# Patient Record
Sex: Female | Born: 1990
Health system: Southern US, Community
[De-identification: ages and names within clinical notes are randomized; demographics above are authoritative.]

## PROBLEM LIST (undated history)

## (undated) DIAGNOSIS — F502 Bulimia nervosa: Secondary | ICD-10-CM

## (undated) DIAGNOSIS — F32A Depression, unspecified: Secondary | ICD-10-CM

## (undated) DIAGNOSIS — F419 Anxiety disorder, unspecified: Secondary | ICD-10-CM

## (undated) DIAGNOSIS — F329 Major depressive disorder, single episode, unspecified: Secondary | ICD-10-CM

## (undated) HISTORY — DX: Depression, unspecified: F32.A

## (undated) HISTORY — DX: Anxiety disorder, unspecified: F41.9

## (undated) HISTORY — DX: Major depressive disorder, single episode, unspecified: F32.9

---
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
## 2011-04-28 ENCOUNTER — Ambulatory Visit (INDEPENDENT_AMBULATORY_CARE_PROVIDER_SITE_OTHER): Payer: Self-pay | Admitting: Family Medicine

## 2011-04-28 ENCOUNTER — Encounter: Payer: Self-pay | Admitting: Family Medicine

## 2011-04-28 DIAGNOSIS — F32A Depression, unspecified: Secondary | ICD-10-CM

## 2011-04-28 DIAGNOSIS — N318 Other neuromuscular dysfunction of bladder: Secondary | ICD-10-CM

## 2011-04-28 DIAGNOSIS — Z01419 Encounter for gynecological examination (general) (routine) without abnormal findings: Secondary | ICD-10-CM

## 2011-04-28 DIAGNOSIS — N3281 Overactive bladder: Secondary | ICD-10-CM

## 2011-04-28 DIAGNOSIS — G4700 Insomnia, unspecified: Secondary | ICD-10-CM

## 2011-04-28 DIAGNOSIS — Z0000 Encounter for general adult medical examination without abnormal findings: Secondary | ICD-10-CM

## 2011-04-28 DIAGNOSIS — F341 Dysthymic disorder: Secondary | ICD-10-CM

## 2011-04-28 DIAGNOSIS — F329 Major depressive disorder, single episode, unspecified: Secondary | ICD-10-CM

## 2011-04-28 MED ORDER — CITALOPRAM HYDROBROMIDE 10 MG PO TABS: ORAL_TABLET | ORAL | Status: DC

## 2011-04-28 NOTE — Patient Instructions (Signed)
Try the celexa.  It does have a small associated weight gain, but you may feel much better on it. Try to exercise (walk) for 30 min at a time daily.  This helps anxiety a lot.   Come back and see me in 2-3 weeks.  We may need to increase the dose.

## 2011-05-01 ENCOUNTER — Encounter: Payer: Self-pay | Admitting: Family Medicine

## 2011-05-01 ENCOUNTER — Telehealth: Payer: Self-pay | Admitting: Family Medicine

## 2011-05-01 DIAGNOSIS — Z01419 Encounter for gynecological examination (general) (routine) without abnormal findings: Secondary | ICD-10-CM | POA: Insufficient documentation

## 2011-05-01 DIAGNOSIS — G4700 Insomnia, unspecified: Secondary | ICD-10-CM | POA: Insufficient documentation

## 2011-05-01 DIAGNOSIS — F319 Bipolar disorder, unspecified: Secondary | ICD-10-CM | POA: Insufficient documentation

## 2011-05-01 DIAGNOSIS — N3281 Overactive bladder: Secondary | ICD-10-CM | POA: Insufficient documentation

## 2011-05-01 MED ORDER — SOLIFENACIN SUCCINATE 10 MG PO TABS: 10.0000 mg | ORAL_TABLET | Freq: Every day | ORAL | Status: DC

## 2011-05-01 MED ORDER — AMITRIPTYLINE HCL 25 MG PO TABS: 25.0000 mg | ORAL_TABLET | Freq: Every day | ORAL | Status: DC

## 2011-05-01 NOTE — Assessment & Plan Note (Signed)
Will start celexa and see back 2-3 weeks

## 2011-05-01 NOTE — Telephone Encounter (Signed)
Confirmed with mom that no concern for bipolar with previous psychiatrist.  Does not have higher energy periods of time, does not get involved in many projects or go without sleep.  Mom confirmed that she had an appt to see me in 3 weeks.

## 2011-05-01 NOTE — Progress Notes (Signed)
  Subjective:     Jeanette Nicholson is a 20 y.o. female and is here for a comprehensive physical exam. The patient reports problems - worried about her weight, anxiety and depression.  Anxiety/depression-  had been treated with buspar in past.  No meds x 1 year.  No SI/Hi.  No manic episodes. Feels that she has OCD component.  Cannot go to psych now as has no insurance.    Weight-  Concerned about weight gain.  Very external locus of control--worried that it might happen without ability to voice how she might have an effect on that outcome.    History   Social History  . Marital Status: Single    Spouse Name: N/A    Number of Children: N/A  . Years of Education: N/A   Occupational History  . Not on file.   Social History Main Topics  . Smoking status: Never Smoker   . Smokeless tobacco: Not on file  . Alcohol Use: No  . Drug Use: No  . Sexually Active: No   Other Topics Concern  . Not on file   Social History Narrative  . No narrative on file   No health maintenance topics applied.  The following portions of the patient's history were reviewed and updated as appropriate: allergies, current medications, past family history, past medical history, past social history, past surgical history and problem list.  Review of Systems Pertinent items are noted in HPI.   Objective:   Vital signs reviewed General appearance - alert, well appearing, and in no distress and oriented to person, place, and time Heart - normal rate, regular rhythm, normal S1, S2, no murmurs, rubs, clicks or gallops Chest - clear to auscultation, no wheezes, rales or rhonchi, symmetric air entry, no tachypnea, retractions or cyanosis Abdomen - soft, nontender, nondistended, no masses or organomegaly Extremities - peripheral pulses normal, no pedal edema, no clubbing or cyanosis Neurological - alert, oriented, normal speech, no focal findings or movement disorder noted, screening mental status exam normal,  neck supple without rigidity, cranial nerves II through XII intact    Assessment:    Healthy female exam.   1. Anxiety/depression-  Had been on buspar and concerta in past.  No meds x 1 year after turning 19.  Will start celexa today for GAD and depression.  See back in 2-3 weeks.  2. Weight concern-  Pt very concerned about gaining weight.  Reviewed diet and exercise (emphasize internal control of actions influencing outcomes)     Plan:     See After Visit Summary for Counseling Recommendations

## 2011-05-19 ENCOUNTER — Ambulatory Visit: Payer: Self-pay | Admitting: Family Medicine

## 2011-05-27 ENCOUNTER — Ambulatory Visit: Payer: Self-pay | Admitting: Family Medicine

## 2011-10-29 ENCOUNTER — Other Ambulatory Visit: Payer: Self-pay | Admitting: Family Medicine

## 2011-10-31 NOTE — Telephone Encounter (Signed)
Refill request

## 2012-05-07 ENCOUNTER — Emergency Department (HOSPITAL_COMMUNITY)
Admission: EM | Admit: 2012-05-07 | Discharge: 2012-05-07 | Disposition: A | Discharge status: Home / Self Care | Payer: Self-pay | Attending: Emergency Medicine | Admitting: Emergency Medicine

## 2012-05-07 ENCOUNTER — Encounter (HOSPITAL_COMMUNITY): Payer: Self-pay | Admitting: *Deleted

## 2012-05-07 DIAGNOSIS — R51 Headache: Secondary | ICD-10-CM | POA: Insufficient documentation

## 2012-05-07 DIAGNOSIS — R202 Paresthesia of skin: Secondary | ICD-10-CM

## 2012-05-07 DIAGNOSIS — F419 Anxiety disorder, unspecified: Secondary | ICD-10-CM

## 2012-05-07 DIAGNOSIS — R4182 Altered mental status, unspecified: Secondary | ICD-10-CM | POA: Insufficient documentation

## 2012-05-07 DIAGNOSIS — R42 Dizziness and giddiness: Secondary | ICD-10-CM | POA: Insufficient documentation

## 2012-05-07 DIAGNOSIS — F329 Major depressive disorder, single episode, unspecified: Secondary | ICD-10-CM | POA: Insufficient documentation

## 2012-05-07 DIAGNOSIS — R209 Unspecified disturbances of skin sensation: Secondary | ICD-10-CM | POA: Insufficient documentation

## 2012-05-07 DIAGNOSIS — F3289 Other specified depressive episodes: Secondary | ICD-10-CM | POA: Insufficient documentation

## 2012-05-07 DIAGNOSIS — F411 Generalized anxiety disorder: Secondary | ICD-10-CM | POA: Insufficient documentation

## 2012-05-07 LAB — DIFFERENTIAL
Basophils Absolute: 0 10*3/uL (ref 0.0–0.1)
Lymphocytes Relative: 25 % (ref 12–46)
Monocytes Absolute: 1.1 10*3/uL — ABNORMAL HIGH (ref 0.1–1.0)
Neutro Abs: 8.8 10*3/uL — ABNORMAL HIGH (ref 1.7–7.7)
Neutrophils Relative %: 65 % (ref 43–77)

## 2012-05-07 LAB — URINE MICROSCOPIC-ADD ON

## 2012-05-07 LAB — URINALYSIS, ROUTINE W REFLEX MICROSCOPIC
Glucose, UA: NEGATIVE mg/dL
pH: 7.5 (ref 5.0–8.0)

## 2012-05-07 LAB — POCT I-STAT, CHEM 8
BUN: 7 mg/dL (ref 6–23)
Calcium, Ion: 1.24 mmol/L (ref 1.12–1.32)
Chloride: 105 mEq/L (ref 96–112)
Potassium: 4.1 mEq/L (ref 3.5–5.1)

## 2012-05-07 LAB — CBC
HCT: 39.7 % (ref 36.0–46.0)
Hemoglobin: 13.1 g/dL (ref 12.0–15.0)
RDW: 13.6 % (ref 11.5–15.5)
WBC: 13.6 10*3/uL — ABNORMAL HIGH (ref 4.0–10.5)

## 2012-05-07 LAB — POCT PREGNANCY, URINE: Preg Test, Ur: NEGATIVE

## 2012-05-07 MED ORDER — LORAZEPAM 1 MG PO TABS: 1.0000 mg | ORAL_TABLET | Freq: Three times a day (TID) | ORAL | Status: AC | PRN

## 2012-05-07 MED ORDER — LORAZEPAM 1 MG PO TABS
1.0000 mg | ORAL_TABLET | Freq: Once | ORAL | Status: AC
  Administered 2012-05-07: 1 mg via ORAL
  Filled 2012-05-07: qty 1

## 2012-05-07 NOTE — ED Notes (Signed)
Pt ambulatory to PODA2 from triage

## 2012-05-07 NOTE — ED Notes (Signed)
Patient states after she returned from work this morning she became dizzy and disoriented and had a sharp and severe headache, patient states facial numbness and arm numbness and tingling.  Patient reports she is confused about who she is and who family members. Patient states same episode occur last week but she just became dizzy.

## 2012-05-07 NOTE — ED Provider Notes (Signed)
Medical screening examination/treatment/procedure(s) were performed by non-physician practitioner and as supervising physician I was immediately available for consultation/collaboration.  Mikell Kazlauskas, MD 05/07/12 2322 

## 2012-05-07 NOTE — ED Notes (Signed)
Discharge inst given  Voiced understanding 

## 2012-05-07 NOTE — Discharge Instructions (Signed)
Anxiety and Panic Attacks Your caregiver has informed you that you are having an anxiety or panic attack. There may be many forms of this. Most of the time these attacks come suddenly and without warning. They come at any time of day, including periods of sleep, and at any time of life. They may be strong and unexplained. Although panic attacks are very scary, they are physically harmless. Sometimes the cause of your anxiety is not known. Anxiety is a protective mechanism of the body in its fight or flight mechanism. Most of these perceived danger situations are actually nonphysical situations (such as anxiety over losing a job). CAUSES  The causes of an anxiety or panic attack are many. Panic attacks may occur in otherwise healthy people given a certain set of circumstances. There may be a genetic cause for panic attacks. Some medications may also have anxiety as a side effect. SYMPTOMS  Some of the most common feelings are:  Intense terror.   Dizziness, feeling faint.   Hot and cold flashes.   Fear of going crazy.   Feelings that nothing is real.   Sweating.   Shaking.   Chest pain or a fast heartbeat (palpitations).   Smothering, choking sensations.   Feelings of impending doom and that death is near.   Tingling of extremities, this may be from over-breathing.   Altered reality (derealization).   Being detached from yourself (depersonalization).  Several symptoms can be present to make up anxiety or panic attacks. DIAGNOSIS  The evaluation by your caregiver will depend on the type of symptoms you are experiencing. The diagnosis of anxiety or panic attack is made when no physical illness can be determined to be a cause of the symptoms. TREATMENT  Treatment to prevent anxiety and panic attacks may include:  Avoidance of circumstances that cause anxiety.   Reassurance and relaxation.   Regular exercise.   Relaxation therapies, such as yoga.   Psychotherapy with a  psychiatrist or therapist.   Avoidance of caffeine, alcohol and illegal drugs.   Prescribed medication.  SEEK IMMEDIATE MEDICAL CARE IF:   You experience panic attack symptoms that are different than your usual symptoms.   You have any worsening or concerning symptoms.  Document Released: 12/05/2005 Document Revised: 11/24/2011 Document Reviewed: 04/08/2010 [facility] Patient Information 2012 [place], [place].Paresthesia Paresthesia is an abnormal burning or prickling sensation. This sensation is generally felt in the hands, arms, legs, or feet. However, it may occur in any part of the body. It is usually not painful. The feeling may be described as:  Tingling or numbness.   "Pins and needles."   Skin crawling.   Buzzing.   Limbs "falling asleep."   Itching.  Most people experience temporary (transient) paresthesia at some time in their lives. CAUSES  Paresthesia may occur when you breathe too quickly (hyperventilation). It can also occur without any apparent cause. Commonly, paresthesia occurs when pressure is placed on a nerve. The feeling quickly goes away once the pressure is removed. For some people, however, paresthesia is a long-lasting (chronic) condition caused by an underlying disorder. The underlying disorder may be:  A traumatic, direct injury to nerves. Examples include a:   Broken (fractured) neck.   Fractured skull.   A disorder affecting the brain and spinal cord (central nervous system). Examples include:   Transverse myelitis.   Encephalitis.   Transient ischemic attack.   Multiple sclerosis.   Stroke.   Tumor or blood vessel problems, such as an arteriovenous malformation pressing  against the brain or spinal cord.   A condition that damages the peripheral nerves (peripheral neuropathy). Peripheral nerves are not part of the brain and spinal cord. These conditions include:   Diabetes.   Peripheral vascular disease.   Nerve entrapment syndromes,  such as carpal tunnel syndrome.   Shingles.   Hypothyroidism.   Vitamin B12 deficiencies.   Alcoholism.   Heavy metal poisoning (lead, arsenic).   Rheumatoid arthritis.   Systemic lupus erythematosus.  DIAGNOSIS  Your caregiver will attempt to find the underlying cause of your paresthesia. Your caregiver may:  Take your medical history.   Perform a physical exam.   Order various lab tests.   Order imaging tests.  TREATMENT  Treatment for paresthesia depends on the underlying cause. HOME CARE INSTRUCTIONS  Avoid drinking alcohol.   You may consider massage or acupuncture to help relieve your symptoms.   Keep all follow-up appointments as directed by your caregiver.  SEEK IMMEDIATE MEDICAL CARE IF:   You feel weak.   You have trouble walking or moving.   You have problems with speech or vision.   You feel confused.   You cannot control your bladder or bowel movements.   You feel numbness after an injury.   You faint.   Your burning or prickling feeling gets worse when walking.   You have pain, cramps, or dizziness.   You develop a rash.  MAKE SURE YOU:  Understand these instructions.   Will watch your condition.   Will get help right away if you are not doing well or get worse.  Document Released: 11/25/2002 Document Revised: 11/24/2011 Document Reviewed: 08/26/2011 [facility] Patient Information 2012 [place], [place].

## 2012-05-07 NOTE — ED Provider Notes (Signed)
History     CSN: [id number]  Arrival date & time 05/07/12  1423   First MD Initiated Contact with Patient 05/07/12 1703      Chief Complaint  Patient presents with  . Dizziness    facial numbness  . Altered Mental Status    confused  . Headache    (Consider location/radiation/quality/duration/timing/severity/associated sxs/prior treatment) HPI Comments: Patient is otherwise healthy 21 year old who presents today because "I don't know who I am and where I am".  She states that her onset of symptoms started at about noon today.  Her boyfriend became concerned because she was becoming more confused.  He states that she is repeating herself and asks the same questions over and over again.  She reports left frontal headache and dizziness with the sensation that she is moving as well.  She reports numbness to bilateral face, arms and legs.  She reports subjective weakness as well.  She denies fever, chills, syncope, blurred vision, nausea, vomiting, ear pain, chest pain, shortness of breath, abdominal pain, dysuria, hematuria, vaginal bleeding, discharge or the possibility of pregnancy.  Only history is of anxiety and depression.  Patient is a 21 y.o. female presenting with altered mental status and headaches. The history is provided by the patient and a friend. No language interpreter was used.  Altered Mental Status This is a new problem. The current episode started today. The problem occurs constantly. The problem has been unchanged. Associated symptoms include headaches, numbness and vertigo. Pertinent negatives include no abdominal pain, anorexia, arthralgias, change in bowel habit, chest pain, chills, congestion, coughing, diaphoresis, fatigue, fever, joint swelling, myalgias, nausea, neck pain, rash, sore throat, swollen glands, urinary symptoms, visual change, vomiting or weakness. The symptoms are aggravated by nothing. She has tried nothing for the symptoms. The treatment provided no  relief.  Headache  Pertinent negatives include no anorexia, no fever, no nausea and no vomiting.    Past Medical History  Diagnosis Date  . Anxiety   . Depression     History reviewed. No pertinent past surgical history.  Family History  Problem Relation Age of Onset  . Depression Mother   . Mental illness Mother     History  Substance Use Topics  . Smoking status: Never Smoker   . Smokeless tobacco: Not on file  . Alcohol Use: No    OB History    Grav Para Term Preterm Abortions TAB SAB Ect Mult Living                  Review of Systems  Constitutional: Negative for fever, chills, diaphoresis and fatigue.  HENT: Negative for congestion, sore throat and neck pain.   Respiratory: Negative for cough.   Cardiovascular: Negative for chest pain.  Gastrointestinal: Negative for nausea, vomiting, abdominal pain, anorexia and change in bowel habit.  Musculoskeletal: Negative for myalgias, joint swelling and arthralgias.  Skin: Negative for rash.  Neurological: Positive for vertigo, numbness and headaches. Negative for weakness.  Psychiatric/Behavioral: Positive for altered mental status.  All other systems reviewed and are negative.    Allergies  Review of patient's allergies indicates no known allergies.  Home Medications  No current outpatient prescriptions on file.  BP 120/78  Pulse 86  Temp(Src) 98 F (36.7 C) (Oral)  Resp 16  SpO2 100%  LMP 04/25/2012  Physical Exam  Nursing note and vitals reviewed. Constitutional: She appears well-developed and well-nourished. No distress.       Smiles and interacts on exam  HENT:  Head: Normocephalic and atraumatic.  Right Ear: External ear normal.  Left Ear: External ear normal.  Nose: Nose normal.  Mouth/Throat: Oropharynx is clear and moist. No oropharyngeal exudate.  Eyes: Conjunctivae are normal. Pupils are equal, round, and reactive to light. No scleral icterus.  Neck: Normal range of motion. Neck supple.    Cardiovascular: Normal rate, regular rhythm and normal heart sounds.  Exam reveals no gallop and no friction rub.   No murmur heard. Pulmonary/Chest: Effort normal and breath sounds normal. No respiratory distress. She has no wheezes. She has no rales. She exhibits no tenderness.  Abdominal: Soft. Bowel sounds are normal. She exhibits no distension and no mass. There is no tenderness. There is no rebound and no guarding.  Musculoskeletal: Normal range of motion. She exhibits no edema and no tenderness.  Lymphadenopathy:    She has no cervical adenopathy.  Neurological: She is alert. She has normal strength. She displays no atrophy and no tremor. A sensory deficit is present. No cranial nerve deficit. She exhibits normal muscle tone. She displays a negative Romberg sign. Coordination and gait normal. GCS eye subscore is 4. GCS verbal subscore is 5. GCS motor subscore is 6.  Reflex Scores:      Tricep reflexes are 2+ on the right side and 2+ on the left side.      Bicep reflexes are 2+ on the right side and 2+ on the left side.      Patellar reflexes are 2+ on the right side and 2+ on the left side.      Achilles reflexes are 2+ on the right side and 2+ on the left side.      Patient reports numbness to entire face, arms and legs, subjective weakness though strength testing is 5/5 in all muscle groups, gait and coordination is normal, remains confused to person and place, though she was able to inform registration of all of her pertinent information including verifying her birthday, address, next of kin information.  Skin: Skin is warm and dry. No rash noted. No erythema. No pallor.  Psychiatric: She has a normal mood and affect. Her behavior is normal. Judgment and thought content normal.    ED Course  Procedures (including critical care time)  Labs Reviewed  CBC - Abnormal; Notable for the following:    WBC 13.6 (*)    All other components within normal limits  DIFFERENTIAL - Abnormal;  Notable for the following:    Neutro Abs 8.8 (*)    Monocytes Absolute 1.1 (*)    All other components within normal limits  POCT I-STAT, CHEM 8  URINALYSIS, ROUTINE W REFLEX MICROSCOPIC   No results found. Results for orders placed during the hospital encounter of 05/07/12  URINALYSIS, ROUTINE W REFLEX MICROSCOPIC      Component Value Range   Color, Urine YELLOW  YELLOW    APPearance TURBID (*) CLEAR    Specific Gravity, Urine 1.022  1.005 - 1.030    pH 7.5  5.0 - 8.0    Glucose, UA NEGATIVE  NEGATIVE (mg/dL)   Hgb urine dipstick NEGATIVE  NEGATIVE    Bilirubin Urine NEGATIVE  NEGATIVE    Ketones, ur NEGATIVE  NEGATIVE (mg/dL)   Protein, ur NEGATIVE  NEGATIVE (mg/dL)   Urobilinogen, UA 0.2  0.0 - 1.0 (mg/dL)   Nitrite NEGATIVE  NEGATIVE    Leukocytes, UA TRACE (*) NEGATIVE   CBC      Component Value Range   WBC 13.6 (*)  4.0 - 10.5 (K/uL)   RBC 4.48  3.87 - 5.11 (MIL/uL)   Hemoglobin 13.1  12.0 - 15.0 (g/dL)   HCT 96.0  45.4 - 09.8 (%)   MCV 88.6  78.0 - 100.0 (fL)   MCH 29.2  26.0 - 34.0 (pg)   MCHC 33.0  30.0 - 36.0 (g/dL)   RDW 11.9  14.7 - 82.9 (%)   Platelets 276  150 - 400 (K/uL)  DIFFERENTIAL      Component Value Range   Neutrophils Relative 65  43 - 77 (%)   Neutro Abs 8.8 (*) 1.7 - 7.7 (K/uL)   Lymphocytes Relative 25  12 - 46 (%)   Lymphs Abs 3.4  0.7 - 4.0 (K/uL)   Monocytes Relative 8  3 - 12 (%)   Monocytes Absolute 1.1 (*) 0.1 - 1.0 (K/uL)   Eosinophils Relative 2  0 - 5 (%)   Eosinophils Absolute 0.3  0.0 - 0.7 (K/uL)   Basophils Relative 0  0 - 1 (%)   Basophils Absolute 0.0  0.0 - 0.1 (K/uL)  POCT I-STAT, CHEM 8      Component Value Range   Sodium 142  135 - 145 (mEq/L)   Potassium 4.1  3.5 - 5.1 (mEq/L)   Chloride 105  96 - 112 (mEq/L)   BUN 7  6 - 23 (mg/dL)   Creatinine, Ser 5.62  0.50 - 1.10 (mg/dL)   Glucose, Bld 84  70 - 99 (mg/dL)   Calcium, Ion 1.30  8.65 - 1.32 (mmol/L)   TCO2 26  0 - 100 (mmol/L)   Hemoglobin 13.9  12.0 - 15.0 (g/dL)     HCT 78.4  69.6 - 29.5 (%)  POCT PREGNANCY, URINE      Component Value Range   Preg Test, Ur NEGATIVE  NEGATIVE   URINE MICROSCOPIC-ADD ON      Component Value Range   Squamous Epithelial / LPF FEW (*) RARE    WBC, UA 0-2  <3 (WBC/hpf)   Bacteria, UA RARE  RARE    Urine-Other MUCOUS PRESENT     No results found.    Paresthesias Anxiety    MDM  Patient with history of depression and anxiety presents with likely an anxiety attack.  Normal neuro exam without focal findings, reports feeling better after ativan.  She has recently been seen by [facility] and started on medication.  Will give short course of ativan and request that she follow up with PCP        Izola Price. [place], [place] 05/07/12 1950

## 2012-05-09 ENCOUNTER — Emergency Department (HOSPITAL_COMMUNITY)
Admission: EM | Admit: 2012-05-09 | Discharge: 2012-05-09 | Disposition: A | Discharge status: Home / Self Care | Payer: Self-pay | Attending: Emergency Medicine | Admitting: Emergency Medicine

## 2012-05-09 ENCOUNTER — Encounter: Payer: Self-pay | Admitting: Family Medicine

## 2012-05-09 ENCOUNTER — Ambulatory Visit (INDEPENDENT_AMBULATORY_CARE_PROVIDER_SITE_OTHER): Payer: Self-pay | Admitting: Family Medicine

## 2012-05-09 ENCOUNTER — Encounter (HOSPITAL_COMMUNITY): Payer: Self-pay | Admitting: *Deleted

## 2012-05-09 VITALS — BP 122/77 | HR 83 | Temp 98.1°F | Ht 64.5 in | Wt 170.0 lb

## 2012-05-09 DIAGNOSIS — F32A Depression, unspecified: Secondary | ICD-10-CM

## 2012-05-09 DIAGNOSIS — F509 Eating disorder, unspecified: Secondary | ICD-10-CM

## 2012-05-09 DIAGNOSIS — F341 Dysthymic disorder: Secondary | ICD-10-CM

## 2012-05-09 DIAGNOSIS — F329 Major depressive disorder, single episode, unspecified: Secondary | ICD-10-CM

## 2012-05-09 DIAGNOSIS — R443 Hallucinations, unspecified: Secondary | ICD-10-CM | POA: Insufficient documentation

## 2012-05-09 DIAGNOSIS — F39 Unspecified mood [affective] disorder: Secondary | ICD-10-CM

## 2012-05-09 DIAGNOSIS — R209 Unspecified disturbances of skin sensation: Secondary | ICD-10-CM | POA: Insufficient documentation

## 2012-05-09 DIAGNOSIS — R51 Headache: Secondary | ICD-10-CM | POA: Insufficient documentation

## 2012-05-09 HISTORY — DX: Bulimia nervosa: F50.2

## 2012-05-09 LAB — COMPREHENSIVE METABOLIC PANEL
ALT: 14 U/L (ref 0–35)
AST: 13 U/L (ref 0–37)
Albumin: 4.2 g/dL (ref 3.5–5.2)
Alkaline Phosphatase: 95 U/L (ref 39–117)
Calcium: 10.5 mg/dL (ref 8.4–10.5)
Potassium: 4 mEq/L (ref 3.5–5.1)
Sodium: 138 mEq/L (ref 135–145)
Total Protein: 7.7 g/dL (ref 6.0–8.3)

## 2012-05-09 LAB — RAPID URINE DRUG SCREEN, HOSP PERFORMED
Amphetamines: NOT DETECTED
Barbiturates: NOT DETECTED
Benzodiazepines: NOT DETECTED
Cocaine: NOT DETECTED

## 2012-05-09 LAB — URINALYSIS, ROUTINE W REFLEX MICROSCOPIC
Bilirubin Urine: NEGATIVE
Ketones, ur: NEGATIVE mg/dL
Leukocytes, UA: NEGATIVE
Nitrite: NEGATIVE
Protein, ur: NEGATIVE mg/dL
pH: 8 (ref 5.0–8.0)

## 2012-05-09 LAB — CBC
Hemoglobin: 13.7 g/dL (ref 12.0–15.0)
MCH: 29.4 pg (ref 26.0–34.0)
MCHC: 33.6 g/dL (ref 30.0–36.0)
Platelets: 307 10*3/uL (ref 150–400)
RDW: 13.3 % (ref 11.5–15.5)

## 2012-05-09 MED ORDER — TRAZODONE HCL 100 MG PO TABS: ORAL_TABLET | ORAL | Status: DC

## 2012-05-09 MED ORDER — FLUOXETINE HCL 20 MG PO TABS: ORAL_TABLET | ORAL | Status: DC

## 2012-05-09 NOTE — Assessment & Plan Note (Signed)
Concerning because voices tell her to hurt herself. I think that she has an element of personality disorder as well. She is not have insurance or other resources to set up close followup. I will send her to [facility] for evaluation today because of concern that she may hurt herself. She, her boyfriend, and I all agreed that she would go straight there. Plan to have her have close followup with myself as well as with psychiatry and therapy.

## 2012-05-09 NOTE — Consult Note (Signed)
Reason for Consult: Depression, anxiety and the eating disorder Referring Physician: Dr. Myrtie Hawk is an 21 y.o. female.  HPI: This is a 21 years old single Caucasian female who is self Conservation officer, nature at [employer]. She has been living with her mom dad and her sister. She has a 5 years old sister who was in school. Patient has a boyfriend who is 70 years old in college and recently came for summer vacation. Patient has taken 2 weeks off from her work because she is feeling nervous, anxious depressed poor sleep patterns and that eating. Patient stated she is feeling that she was very very over weight does not like herself trying to lose weight by taking Slim fast paced twice daily over doing her exercise and throwing up after eating for the last 2 years. Patient stated that she was diagnosed with attention deficit hyperactivity disorder, depression and anxiety and has received medications until 3 years ago. Patient stated she gained weight since she stopped taking her medication. Patient has a wake symptoms of auditory hallucinations and the bad dreams of demons trying to attack her. Patient has denied symptoms of mania and psychosis. Patient has no history of for substance abuse, in abuse and victimization. She has no history of legal charges. 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 Patient urine drug screen was negative for substance of abuse.  Patient has family history of bipolar disorder in her mother and her father has been disabled secondary to a work related injury. Patient feels dealing with her mother is stressful and trying to be supportive to them. She also considered having the financial stresses which make her depressed from time to time.  Mental status: Ration the appeared as per her stated age with the height of 5 foot 4 inches and weight of 160 pounds dressed in a hospital blue scrubs, calm, quite and cooperative during this  evaluation. She does not do psychomotor activity her stated mood is depression and anxious, she was briefly dysphoric during this visit. Patient has linear and goal-directed thoughts except body emage and obsessed and about not getting fat and also has a fear about becoming like her mother. Patient has denied auditory or visual hallucinations, delusions and paranoia. Patient has denied suicidal ideation, homicidal ideation, intentions and plans. She has a fair insight but poor judgment and impulse control.  Past Medical History  Diagnosis Date  . Anxiety   . Depression   . Bulimia     History reviewed. No pertinent past surgical history.  Family History  Problem Relation Age of Onset  . Depression Mother   . Mental illness Mother     Social History:  reports that she has never smoked. She does not have any smokeless tobacco history on file. She reports that she does not drink alcohol or use illicit drugs.  Allergies: No Known Allergies  Medications: I have reviewed the patient's current medications.  Results for orders placed during the hospital encounter of 05/09/12 (from the past 48 hour(s))  CBC     Status: Normal   Collection Time   05/09/12 12:15 PM      Component Value Range Comment   WBC 10.3  4.0 - 10.5 (K/uL)    RBC 4.66  3.87 - 5.11 (MIL/uL)    Hemoglobin 13.7  12.0 - 15.0 (g/dL)    HCT 40.9  81.1 - 91.4 (%)    MCV 87.6  78.0 -  100.0 (fL)    MCH 29.4  26.0 - 34.0 (pg)    MCHC 33.6  30.0 - 36.0 (g/dL)    RDW 56.2  13.0 - 86.5 (%)    Platelets 307  150 - 400 (K/uL)   COMPREHENSIVE METABOLIC PANEL     Status: Abnormal   Collection Time   05/09/12 12:15 PM      Component Value Range Comment   Sodium 138  135 - 145 (mEq/L)    Potassium 4.0  3.5 - 5.1 (mEq/L)    Chloride 103  96 - 112 (mEq/L)    CO2 24  19 - 32 (mEq/L)    Glucose, Bld 101 (*) 70 - 99 (mg/dL)    BUN 7  6 - 23 (mg/dL)    Creatinine, Ser 7.84  0.50 - 1.10 (mg/dL)    Calcium 69.6  8.4 - 10.5 (mg/dL)     Total Protein 7.7  6.0 - 8.3 (g/dL)    Albumin 4.2  3.5 - 5.2 (g/dL)    AST 13  0 - 37 (U/L)    ALT 14  0 - 35 (U/L)    Alkaline Phosphatase 95  39 - 117 (U/L)    Total Bilirubin 0.3  0.3 - 1.2 (mg/dL)    GFR calc non Af Amer >90  >90 (mL/min)    GFR calc Af Amer >90  >90 (mL/min)   ETHANOL     Status: Normal   Collection Time   05/09/12 12:15 PM      Component Value Range Comment   Alcohol, Ethyl (B) <11  0 - 11 (mg/dL)   URINE RAPID DRUG SCREEN (HOSP PERFORMED)     Status: Normal   Collection Time   05/09/12  2:04 PM      Component Value Range Comment   Opiates NONE DETECTED  NONE DETECTED     Cocaine NONE DETECTED  NONE DETECTED     Benzodiazepines NONE DETECTED  NONE DETECTED     Amphetamines NONE DETECTED  NONE DETECTED     Tetrahydrocannabinol NONE DETECTED  NONE DETECTED     Barbiturates NONE DETECTED  NONE DETECTED    URINALYSIS, ROUTINE W REFLEX MICROSCOPIC     Status: Normal   Collection Time   05/09/12  2:04 PM      Component Value Range Comment   Color, Urine YELLOW  YELLOW     APPearance CLEAR  CLEAR     Specific Gravity, Urine 1.020  1.005 - 1.030     pH 8.0  5.0 - 8.0     Glucose, UA NEGATIVE  NEGATIVE (mg/dL)    Hgb urine dipstick NEGATIVE  NEGATIVE     Bilirubin Urine NEGATIVE  NEGATIVE     Ketones, ur NEGATIVE  NEGATIVE (mg/dL)    Protein, ur NEGATIVE  NEGATIVE (mg/dL)    Urobilinogen, UA 0.2  0.0 - 1.0 (mg/dL)    Nitrite NEGATIVE  NEGATIVE     Leukocytes, UA NEGATIVE  NEGATIVE  MICROSCOPIC NOT DONE ON URINES WITH NEGATIVE PROTEIN, BLOOD, LEUKOCYTES, NITRITE, OR GLUCOSE <1000 mg/dL.  PREGNANCY, URINE     Status: Normal   Collection Time   05/09/12  2:04 PM      Component Value Range Comment   Preg Test, Ur NEGATIVE  NEGATIVE      No results found.  No psychosis and Positive for ADHD, anorexia, anxiety, bad mood, depression, sleep disturbance and Overweight. Blood pressure 114/77, pulse 77, temperature 98.2 F (36.8 C), temperature source  Oral, resp.  rate 18, last menstrual period 04/25/2012, SpO2 99.00%.   Assessment/Plan: Eating disorder not otherwise specified versus bulimia nervosa Mood disorder not otherwise specified Rule out bipolar disorder  Recommends antidepressant medication fluoxetine 20 mg tablets take half tablet daily for one week and than whole tablet. Take trazodone 100 mg tablets half tablet daily for one week and than whole pill daily night for better sleep Followup with [facility] for outpatient psychiatric services Pill provide 30 day supply of medication because he knew appointments takes time to get into office Discharge patient to her family care.   Contrell Ballentine,JANARDHAHA R. 05/09/2012, 5:58 PM

## 2012-05-09 NOTE — Progress Notes (Signed)
  Subjective:    Patient ID: Jeanette Nicholson, female    DOB: 04/04/1991, 21 y.o.   MRN: [id number]  HPI  Patient comes in today for evaluation of depression and anxiety that is going on for 7 years. She says it is getting worse recently because she is having episodes where she feels that she does not know where she or who is she is. She states that she is hearing voices that are telling her to hurt herself or kill herself. She says that these have been going on for several weeks. She says that the worst that she has done as cut herself and that was 2-3 weeks ago. She does not have a plan for hurting herself. She's never had a serious attempt at suicide.  She is concerned because it tends to worsen at night when she will get paranoid, hearing voices, and she thinks in the status.  Patient eats one to 2 meals per day but she will get nauseous and throw up occasionally. She is sleeping 8 or more hours a day but feels that she has lots of nightmares. She says that she has bulimia put on patient report feels that her vomiting is more due to anxiousness and not intentional gagging.  One year ago she was started on Celexa which she took for about one month. She says that it made her feel worse and angry. She stopped it quickly.  Patient does not have insurance and she does not have an appointment set up for psychiatry or therapy.  Patient has a family history of anxiety and depression. Review of Systems See above    Objective:   Physical Exam Vital signs reviewed General appearance - alert, well appearing, and in no distress Psych-not tearful or anxious appearing today. Good eye contact, well groomed.       Assessment & Plan:

## 2012-05-09 NOTE — ED Notes (Signed)
Pt's belongings sent with boyfriend

## 2012-05-09 NOTE — ED Notes (Signed)
Pt admitted to Psych ED from triage. States she has been having attacks of anxiety in which her face and head get numb, she feels afraid and becomes confused and disoriented. States presently she is all right and not having these sx. She is alert and oriented X 4. She states she has h/o AH since childhood and the voices tell her to hurt herself, but she hasn't heard the voices in about one month. States she practices distraction when she hears the voices. States she has h/o some self-mutilation but also uses distraction to avoid acting out this behavior. Endorses VH; says they are shadows. She denies SI/HI. Denies SA. Pt is pleasant and cooperative. Oriented to unit policies. Cont to monitor.

## 2012-05-09 NOTE — ED Notes (Signed)
Pt has been wanded 

## 2012-05-09 NOTE — Discharge Instructions (Signed)
Depression You have signs of depression. This is a common problem. It can occur at any age. It is often hard to recognize. People can suffer from depression and still have moments of enjoyment. Depression interferes with your basic ability to function in life. It upsets your relationships, sleep, eating, and work habits. CAUSES  Depression is believed to be caused by an imbalance in brain chemicals. It may be triggered by an unpleasant event. Relationship crises, a death in the family, financial worries, retirement, or other stressors are normal causes of depression. Depression may also start for no known reason. Other factors that may play a part include medical illnesses, some medicines, genetics, and alcohol or drug abuse. SYMPTOMS   Feeling unhappy or worthless.   Long-lasting (chronic) tiredness or worn-out feeling.   Self-destructive thoughts and actions.   Not being able to sleep or sleeping too much.   Eating more than usual or not eating at all.   Headaches or feeling anxious.   Trouble concentrating or making decisions.   Unexplained physical problems and substance abuse.  TREATMENT  Depression usually gets better with treatment. This can include:  Antidepressant medicines. It can take weeks before the proper dose is achieved and benefits are reached.   Talking with a therapist, clergyperson, counselor, or friend. These people can help you gain insight into your problem and regain control of your life.   Eating a good diet.   Getting regular physical exercise, such as walking for 30 minutes every day.   Not abusing alcohol or drugs.  Treating depression often takes 6 months or longer. This length of treatment is needed to keep symptoms from returning. Call your caregiver and arrange for follow-up care as suggested. SEEK IMMEDIATE MEDICAL CARE IF:   You start to have thoughts of hurting yourself or others.   Call your local emergency services (911 in [place]   Go to  your local medical emergency department.   Call the National Suicide Prevention Lifeline: 1-800-273-TALK (213)510-9014).  Document Released: 12/05/2005 Document Revised: 11/24/2011 Document Reviewed: 05/07/2010 [facility] Patient Information 2012 [place], [place].Hallucinations and Delusions You seem to be having hallucinations and/or delusions. You may be hearing voices that no one else can hear. This can seem very real to you. You may be having thoughts and fears that do not make sense to others. This condition can be due to mental disease like schizophrenia. It may be caused by a medical condition, such as an infection or electrolyte disturbance. These symptoms are also seen in drug abusers, especially those who use crack cocaine and amphetamines. Drugs like PCP, LSD, MDMA, peyote, and psilocybin can also cause frightening hallucinations and loss of control. If your symptoms are due to drug abuse, your mental state should improve as the drug(s) leave your system. Someone you trust should be with you until you are better to protect you and calm your fears. Often tranquilizers are very helpful at controlling hallucinations, anxiety, and destructive behavior. Getting a proper diet and enough sleep is important to recovery. If your symptoms are not due to drugs, or do not improve over several days after stopping drug use, you need further medical or mental health care. SEEK IMMEDIATE MEDICAL CARE IF:   Your symptoms get worse, especially if you think your life is in danger   You have violent or destructive thoughts.  Recovery is possible, but you have to get proper treatment and avoid drugs that are known to cause you trouble. Document Released: 01/12/2005 Document Revised: 11/24/2011  Document Reviewed: 12/05/2005 [facility] Patient Information 2012 [place], [place].

## 2012-05-09 NOTE — ED Notes (Signed)
Pt discharged home with father. Instructions reviewed. Pt verbalized understanding. Denies SI/HI.

## 2012-05-09 NOTE — Patient Instructions (Signed)
Thank you for coming in to see me today I am very concerned about what's going on and I want you to be seen as soon as possible   We both agree that you will go to [facility] for evaluation today as soon as you leave here.  Please do not go home first, just head over there.

## 2012-05-09 NOTE — ED Provider Notes (Signed)
History     CSN: [id number]  Arrival date & time 05/09/12  1228   First MD Initiated Contact with Patient 05/09/12 1455      Chief Complaint  Patient presents with  . Medical Clearance  . Hallucinations    (Consider location/radiation/quality/duration/timing/severity/associated sxs/prior treatment) The history is provided by the patient.   patient states that she's had episodes of disorientation for last 2 days. She was seen in: Hospital for the same and diagnosed with anxiety. She was given Ativan, but states she has not filled the prescription. She is also had hallucinations both auditory and visual. She states she hears voices that tell her to herself. She states she'll not act on it. She's a history of depression. Suicide attempts. She states that she is seeing shadows for all of her life. Patient states that she is bulimic. She states that she throws up after most meals. She does not have a psychiatrist. She does not have a therapist. She states she would not trying to hurt herself, but like to get some help before she gets that point. She is a dull headache that started this morning. She states she has tingling in her face. She states she also has episodes of tingling in her arms and legs.   Past Medical History  Diagnosis Date  . Anxiety   . Depression   . Bulimia     History reviewed. No pertinent past surgical history.  Family History  Problem Relation Age of Onset  . Depression Mother   . Mental illness Mother     History  Substance Use Topics  . Smoking status: Never Smoker   . Smokeless tobacco: Not on file  . Alcohol Use: No    OB History    Grav Para Term Preterm Abortions TAB SAB Ect Mult Living                  Review of Systems  Constitutional: Negative for activity change and appetite change.  HENT: Negative for neck stiffness.   Eyes: Negative for pain.  Respiratory: Negative for chest tightness and shortness of breath.   Cardiovascular: Negative  for chest pain and leg swelling.  Gastrointestinal: Negative for nausea, vomiting, abdominal pain and diarrhea.  Genitourinary: Negative for flank pain.  Musculoskeletal: Negative for back pain.  Skin: Negative for rash.  Neurological: Positive for numbness and headaches. Negative for weakness.  Psychiatric/Behavioral: Positive for hallucinations, confusion and dysphoric mood. Negative for behavioral problems and self-injury.    Allergies  Review of patient's allergies indicates no known allergies.  Home Medications   Current Outpatient Rx  Name Route Sig Dispense Refill  . LORAZEPAM 1 MG PO TABS Oral Take 1 tablet (1 mg total) by mouth every 8 (eight) hours as needed for anxiety. 10 tablet 0  . FLUOXETINE HCL 20 MG PO TABS  1/2 tablet once a day for 1 week then 1 pill a day 30 tablet 0  . TRAZODONE HCL 100 MG PO TABS  1/2 tablet po qhs for 1 week then 1 pill qhs 30 tablet 0    BP 114/77  Pulse 77  Temp(Src) 98.2 F (36.8 C) (Oral)  Resp 18  SpO2 99%  LMP 04/25/2012  Physical Exam  Nursing note and vitals reviewed. Constitutional: She is oriented to person, place, and time. She appears well-developed and well-nourished.  HENT:  Head: Normocephalic and atraumatic.  Eyes: EOM are normal. Pupils are equal, round, and reactive to light.  Neck: Normal range of  motion. Neck supple.  Cardiovascular: Normal rate, regular rhythm and normal heart sounds.   No murmur heard. Pulmonary/Chest: Effort normal and breath sounds normal. No respiratory distress. She has no wheezes. She has no rales.  Abdominal: Soft. Bowel sounds are normal. She exhibits no distension. There is no tenderness. There is no rebound and no guarding.  Musculoskeletal: Normal range of motion.  Neurological: She is alert and oriented to person, place, and time. No cranial nerve deficit.  Skin: Skin is warm and dry.  Psychiatric: She has a normal mood and affect. Her speech is normal.    ED Course  Procedures  (including critical care time)  Labs Reviewed  COMPREHENSIVE METABOLIC PANEL - Abnormal; Notable for the following:    Glucose, Bld 101 (*)    All other components within normal limits  CBC  ETHANOL  URINE RAPID DRUG SCREEN (HOSP PERFORMED)  URINALYSIS, ROUTINE W REFLEX MICROSCOPIC  PREGNANCY, URINE  LAB REPORT - SCANNED   No results found.   1. Depression   2. Hallucination       MDM  telepsych consult done. Medications started and will follow up.        Juliet Rude. Rubin Payor, MD 05/11/12 [phone]

## 2012-05-09 NOTE — ED Notes (Signed)
Pt reports she's been having episodes of disorientation, initially it started x 2 days ago, and then it reocurred last night.  Pt reports the disorientation is new, has hx of depression.  Pt reports 2 days ago, she was seen at [facility] ED for numbness in her face and arms.  Pt reports auditory and visual hallucinations as well, states that she sees "shadows" and hears voices telling her to hurt herself but states that she hasnt acted on it.  Pt is calm and cooperative at this time.  Pt also reports hx of eating DO.  Pt reports "dull" h/a at this time which started this am.

## 2012-05-28 ENCOUNTER — Encounter: Payer: Self-pay | Admitting: Family Medicine

## 2012-05-28 ENCOUNTER — Ambulatory Visit (INDEPENDENT_AMBULATORY_CARE_PROVIDER_SITE_OTHER): Payer: Self-pay | Admitting: Family Medicine

## 2012-05-28 VITALS — BP 113/74 | HR 82 | Ht 64.5 in | Wt 168.0 lb

## 2012-05-28 DIAGNOSIS — F341 Dysthymic disorder: Secondary | ICD-10-CM

## 2012-05-28 DIAGNOSIS — F419 Anxiety disorder, unspecified: Secondary | ICD-10-CM

## 2012-05-28 DIAGNOSIS — F32A Depression, unspecified: Secondary | ICD-10-CM

## 2012-05-28 NOTE — Patient Instructions (Signed)
It was good to see today I'm referring you to the [facility] center to help with your psychiatric issues If you develop any homicidal or suicidal ideations go to [facility] or give someone a call.  Otherwise follow up in 1-2 months.  Call if any questions

## 2012-05-28 NOTE — Progress Notes (Signed)
  Subjective:    Patient ID: Jeanette Nicholson, female    DOB: July 23, 1991, 21 y.o.   MRN: [id number]  HPI Patient presents today for mood followup  Patient was seen by Dr. Hulen Luster on May 22 for auditory hallucinations as well as previous suicidal activity. Patient subsequently sent to [facility] long. Patient was evaluated by psychiatry. Patient was placed on Prozac and trazodone. With recommendation of outpatient followup Today, patient states that mood is essentially unchanged since evaluation at [facility]. Patient states homicidal or suicidal ideations had not been present since emergency department visit. Mood is mildly improved since starting the Prozac. Trazodone has been minimally effective in helping with sleep. Patient states that she got aren't card as of today. No HI/SI currently   Review of Systems See HPI, otherwise ROS negative     Objective:   Physical Exam Gen: up in chair, NAD HEENT: NCAT, EOMI, TMs clear bilaterally CV: RRR, no murmurs auscultated PULM: CTAB, no wheezes, rales, rhoncii ABD: S/NT/+ bowel sounds  EXT: 2+ peripheral pulses         Assessment & Plan:

## 2012-06-04 NOTE — Assessment & Plan Note (Addendum)
Given overall symptom profile, pt would benefit from antipsychotic. However, given cost and time constraints, pt formally referred to [facility] center for further assessment. No HI/SI which is reassuring.  Case precepted with Dr. Pascal Lux.

## 2012-06-13 ENCOUNTER — Emergency Department (HOSPITAL_COMMUNITY)
Admission: EM | Admit: 2012-06-13 | Discharge: 2012-06-13 | Disposition: A | Discharge status: Home / Self Care | Payer: Self-pay | Attending: Emergency Medicine | Admitting: Emergency Medicine

## 2012-06-13 ENCOUNTER — Encounter (HOSPITAL_COMMUNITY): Payer: Self-pay | Admitting: Emergency Medicine

## 2012-06-13 ENCOUNTER — Emergency Department (HOSPITAL_COMMUNITY): Payer: Self-pay

## 2012-06-13 DIAGNOSIS — F341 Dysthymic disorder: Secondary | ICD-10-CM | POA: Insufficient documentation

## 2012-06-13 DIAGNOSIS — R209 Unspecified disturbances of skin sensation: Secondary | ICD-10-CM | POA: Insufficient documentation

## 2012-06-13 DIAGNOSIS — R202 Paresthesia of skin: Secondary | ICD-10-CM

## 2012-06-13 DIAGNOSIS — M62838 Other muscle spasm: Secondary | ICD-10-CM | POA: Insufficient documentation

## 2012-06-13 DIAGNOSIS — R569 Unspecified convulsions: Secondary | ICD-10-CM | POA: Insufficient documentation

## 2012-06-13 LAB — POCT I-STAT, CHEM 8
Calcium, Ion: 1.27 mmol/L (ref 1.12–1.32)
Creatinine, Ser: 0.8 mg/dL (ref 0.50–1.10)
Glucose, Bld: 88 mg/dL (ref 70–99)
HCT: 41 % (ref 36.0–46.0)
Hemoglobin: 13.9 g/dL (ref 12.0–15.0)
Potassium: 4.6 mEq/L (ref 3.5–5.1)
TCO2: 25 mmol/L (ref 0–100)

## 2012-06-13 LAB — CBC WITH DIFFERENTIAL/PLATELET
Basophils Relative: 0 % (ref 0–1)
Eosinophils Absolute: 0.3 10*3/uL (ref 0.0–0.7)
Eosinophils Relative: 3 % (ref 0–5)
Hemoglobin: 13.3 g/dL (ref 12.0–15.0)
Lymphs Abs: 2.3 10*3/uL (ref 0.7–4.0)
MCH: 29.1 pg (ref 26.0–34.0)
MCHC: 33 g/dL (ref 30.0–36.0)
MCV: 88.2 fL (ref 78.0–100.0)
Monocytes Relative: 8 % (ref 3–12)
Neutrophils Relative %: 67 % (ref 43–77)
Platelets: 268 10*3/uL (ref 150–400)
RBC: 4.57 MIL/uL (ref 3.87–5.11)

## 2012-06-13 NOTE — Discharge Instructions (Signed)
It is VERY important to follow up closely with neurology and a primary care provider for further evaluation of these episodes. Do not drive until cleared by neurology. Return to ER at any time for emergent changing or worsening of symptoms.

## 2012-06-13 NOTE — ED Provider Notes (Signed)
History     CSN: [id number]  Arrival date & time 06/13/12  1216   First MD Initiated Contact with Patient 06/13/12 1347      Chief Complaint  Patient presents with  . Seizures  . Spasms    (Consider location/radiation/quality/duration/timing/severity/associated sxs/prior treatment) Patient is a 21 y.o. female presenting with seizures. The history is provided by the patient and medical records.  Seizures   Patient presents to emergency department complaining of a one month history of intermittent episodes where she states she will begin to shake violently with her body stiffening and occasionally some tingling down the left side of her body. Patient states this has been happening daily for one month and sometimes multiple times a day. Patient states she remembers all of the events throughout the course of the episode. She denies urinary incontinence. Patient states episodes will last minutes to sometimes an hour. She denies fevers, chills, headache, dizziness, difficulty ambulating, extremity numbness or weakness, chest pain, shortness breath, abdominal pain, nausea, vomiting, diarrhea. Patient states she has been taking her medications for bipolar as directed and denies illicit drug use or alcohol use. Denies aggravating or alleviating factors.   Past Medical History  Diagnosis Date  . Anxiety   . Depression   . Bulimia     History reviewed. No pertinent past surgical history.  Family History  Problem Relation Age of Onset  . Depression Mother   . Mental illness Mother     History  Substance Use Topics  . Smoking status: Never Smoker   . Smokeless tobacco: Not on file  . Alcohol Use: No    OB History    Grav Para Term Preterm Abortions TAB SAB Ect Mult Living                  Review of Systems  Neurological: Positive for seizures.  All other systems reviewed and are negative.    Allergies  Review of patient's allergies indicates no known allergies.  Home  Medications   Current Outpatient Rx  Name Route Sig Dispense Refill  . ARIPIPRAZOLE 30 MG PO TABS Oral Take 15 mg by mouth at bedtime.    . FLUOXETINE HCL 10 MG PO TABS Oral Take 10 mg by mouth every morning.       BP 116/66  Pulse 76  Temp 98.3 F (36.8 C) (Oral)  Resp 18  SpO2 99%  LMP 05/26/2012  Physical Exam  Nursing note and vitals reviewed. Constitutional: She is oriented to person, place, and time. She appears well-developed and well-nourished. No distress.  HENT:  Head: Normocephalic and atraumatic.  Eyes: Conjunctivae and EOM are normal. Pupils are equal, round, and reactive to light.       No nystagmus.   Neck: Normal range of motion. Neck supple.  Cardiovascular: Normal rate, regular rhythm, normal heart sounds and intact distal pulses.  Exam reveals no gallop and no friction rub.   No murmur heard. Pulmonary/Chest: Effort normal and breath sounds normal. No respiratory distress. She has no wheezes. She has no rales. She exhibits no tenderness.  Abdominal: Soft. Bowel sounds are normal. She exhibits no distension and no mass. There is no tenderness. There is no rebound and no guarding.  Musculoskeletal: Normal range of motion. She exhibits no edema and no tenderness.  Neurological: She is alert and oriented to person, place, and time. No cranial nerve deficit. Coordination normal.       Normal finger to nose and heel to shin. Ambulating  normal. No ataxia.   Skin: Skin is warm and dry. No rash noted. She is not diaphoretic. No erythema.  Psychiatric: She has a normal mood and affect.    ED Course  Procedures (including critical care time)   Labs Reviewed  CBC WITH DIFFERENTIAL  POCT I-STAT, CHEM 8   Ct Head Wo Contrast  06/13/2012  *RADIOLOGY REPORT*  Clinical Data: Left facial numbness.  Left upper extremity numbness.  Headache  CT HEAD WITHOUT CONTRAST  Technique:  Contiguous axial images were obtained from the base of the skull through the vertex without  contrast.  Comparison: None  Findings: The ventricle size is normal.  Negative for acute or chronic infarct.  No white matter lesions are identified.  No hemorrhage or mass is seen.  Frothy secretions in the left sphenoid sinus.  Calvarium is intact.  IMPRESSION: Normal CT of the brain.  Sinusitis  Original Report Authenticated By: Camelia Phenes, M.D.     1. Muscle spasm   2. Paresthesias       MDM  Dr. Effie Shy agreeable with assessment and plan with patient's description of episodes not consistent with seizures or stroke with no acute findings on labs or CT scan. Given patient's psychiatric hx, question somatization. Patient agreeable to following up with PCP stating she has a follow up appointment tomorrow.         [place], [place] 06/13/12 1939

## 2012-06-13 NOTE — ED Notes (Signed)
Urine collected labeled and placed at bedside.

## 2012-06-13 NOTE — ED Notes (Signed)
Pt c/o of convulsions that have been occuring 3x a day for a month. States that her entire body locks up and she starts shaking, afterwards she feels weak and tired. Has spur of the moment twitches.

## 2012-06-14 ENCOUNTER — Encounter: Payer: Self-pay | Admitting: Family Medicine

## 2012-06-14 ENCOUNTER — Ambulatory Visit (INDEPENDENT_AMBULATORY_CARE_PROVIDER_SITE_OTHER): Payer: Self-pay | Admitting: Family Medicine

## 2012-06-14 VITALS — BP 121/84 | HR 88 | Ht 64.5 in | Wt 168.0 lb

## 2012-06-14 DIAGNOSIS — R531 Weakness: Secondary | ICD-10-CM

## 2012-06-14 DIAGNOSIS — M6281 Muscle weakness (generalized): Secondary | ICD-10-CM

## 2012-06-14 NOTE — Progress Notes (Signed)
  Subjective:    Patient ID: Jeanette Nicholson, female    DOB: 1991/07/07, 21 y.o.   MRN: [id number]  HPI Left sided weakness/numbess:  Pt reports left-sided face numbness and left arm and left leg numbness. Patient also States episodes of like this occur 1-3 times per day. The episodes last 25 minutes to 3 hours. Sometimes her left arm will jerk during these episodes. Patient states it has been occurring x1 month. Patient also states x1 month she has had left-sided weakness in hand and leg. This is all the time, not just during episodes of numbness. Patient had a fall at work yesterday do to weakness and numbness during an episode. Was sent to the ER for evaluation. Did a CT scan which was negative. Was told to follow up with neurology. Patient here today for followup. Patient has significant past history of anxiety, depression and bulimia. Is currently taking Abilify and Prozac. No vision changes. No headache. No fever. No abnormal gait. No bowel or bladder problems.  Smoking status reviewed.   Review of Systems As per above.    Objective:   Physical Exam  Constitutional: She appears well-developed and well-nourished.  HENT:  Head: Normocephalic and atraumatic.  Mouth/Throat: Oropharynx is clear and moist.  Eyes: EOM are normal. Pupils are equal, round, and reactive to light. Right eye exhibits no discharge. Left eye exhibits no discharge.  Neck: Normal range of motion. Neck supple.  Cardiovascular: Normal rate, regular rhythm and normal heart sounds.   No murmur heard. Pulmonary/Chest: Effort normal and breath sounds normal. No respiratory distress.  Abdominal: Soft. She exhibits no distension.  Neurological: She is alert. She has normal reflexes. She displays normal reflexes. No cranial nerve deficit. She exhibits normal muscle tone. Coordination normal.       5/5 strength in all 4 ext.  But, Decreased strength in left grasp and left arm and leg strength when compared to right.    Normal RAM and point to point testing.   Normal gait.   Skin: No rash noted.  Psychiatric: She has a normal mood and affect.          Assessment & Plan:

## 2012-06-15 ENCOUNTER — Telehealth: Payer: Self-pay | Admitting: Family Medicine

## 2012-06-15 NOTE — Telephone Encounter (Signed)
Message copied by Barnie Alderman on Fri Jun 15, 2012  4:27 PM ------      Message from: [place], [place] M      Created: Fri Jun 15, 2012  4:03 PM       Can you please schedule patient for mri asap?  I though I had put this order in yesterday but checked today and realized that I did not order this.  Would like it done on Monday or over weekend if at all possible.  Thanks, [employer]

## 2012-06-15 NOTE — Telephone Encounter (Signed)
MRI sched for Monday, July 1 at 9:00pm at [facility]. No appt available at [facility].Pt notified.

## 2012-06-16 DIAGNOSIS — M6281 Muscle weakness (generalized): Secondary | ICD-10-CM | POA: Insufficient documentation

## 2012-06-16 NOTE — Assessment & Plan Note (Addendum)
Nuero exam completely normal except for decreased strength in left compared to right.  Unclear if difference in strength is a true finding, or due to patient effort.  Assessment also complicated by history of mental illness.  Due to patient symptoms and physical exam finding of weakness will move forward with MRI of brain to r/o organic cause of symptoms.  Will consider referral to neurology if unable to find cause and if symptoms persist. Pt to return for f/up and review of MRI results in 1 week.

## 2012-06-18 ENCOUNTER — Ambulatory Visit (HOSPITAL_COMMUNITY)
Admission: RE | Admit: 2012-06-18 | Discharge: 2012-06-18 | Disposition: A | Discharge status: Home / Self Care | Payer: Self-pay | Source: Ambulatory Visit | Attending: Family Medicine | Admitting: Family Medicine

## 2012-06-18 DIAGNOSIS — R531 Weakness: Secondary | ICD-10-CM

## 2012-06-18 DIAGNOSIS — M6281 Muscle weakness (generalized): Secondary | ICD-10-CM | POA: Insufficient documentation

## 2012-06-18 DIAGNOSIS — R209 Unspecified disturbances of skin sensation: Secondary | ICD-10-CM | POA: Insufficient documentation

## 2012-06-18 DIAGNOSIS — R55 Syncope and collapse: Secondary | ICD-10-CM | POA: Insufficient documentation

## 2012-06-18 MED ORDER — GADOBENATE DIMEGLUMINE 529 MG/ML IV SOLN
15.0000 mL | Freq: Once | INTRAVENOUS | Status: AC | PRN
  Administered 2012-06-18: 15 mL via INTRAVENOUS

## 2012-06-19 ENCOUNTER — Telehealth: Payer: Self-pay | Admitting: Family Medicine

## 2012-06-19 NOTE — Telephone Encounter (Signed)
Mom is calling for results of MRI.

## 2012-06-19 NOTE — ED Provider Notes (Signed)
Medical screening examination/treatment/procedure(s) were performed by non-physician practitioner and as supervising physician I was immediately available for consultation/collaboration.  Flint Melter, MD 06/19/12 1059

## 2012-06-19 NOTE — Telephone Encounter (Signed)
To caviness who ordered. Fleeger, Maryjo Rochester

## 2012-06-22 ENCOUNTER — Ambulatory Visit (INDEPENDENT_AMBULATORY_CARE_PROVIDER_SITE_OTHER): Payer: Self-pay | Admitting: Family Medicine

## 2012-06-22 ENCOUNTER — Encounter: Payer: Self-pay | Admitting: Family Medicine

## 2012-06-22 VITALS — BP 128/87 | HR 75 | Ht 64.5 in | Wt 171.0 lb

## 2012-06-22 DIAGNOSIS — Z309 Encounter for contraceptive management, unspecified: Secondary | ICD-10-CM

## 2012-06-22 DIAGNOSIS — M6281 Muscle weakness (generalized): Secondary | ICD-10-CM

## 2012-06-22 DIAGNOSIS — F341 Dysthymic disorder: Secondary | ICD-10-CM

## 2012-06-22 DIAGNOSIS — F329 Major depressive disorder, single episode, unspecified: Secondary | ICD-10-CM

## 2012-06-22 DIAGNOSIS — F32A Depression, unspecified: Secondary | ICD-10-CM

## 2012-06-22 DIAGNOSIS — IMO0001 Reserved for inherently not codable concepts without codable children: Secondary | ICD-10-CM

## 2012-06-22 MED ORDER — LORAZEPAM 1 MG PO TABS: 1.0000 mg | ORAL_TABLET | Freq: Every day | ORAL | Status: AC | PRN

## 2012-06-22 MED ORDER — NORGESTIM-ETH ESTRAD TRIPHASIC 0.18/0.215/0.25 MG-35 MCG PO TABS: 1.0000 | ORAL_TABLET | Freq: Every day | ORAL | Status: DC

## 2012-06-22 NOTE — Progress Notes (Signed)
  Subjective:    Patient ID: Jeanette Nicholson, female    DOB: Jul 23, 1991, 21 y.o.   MRN: [id number]  HPI 1. Left-sided weakness Unchanged since last visit on 06/29.   She has been very anxious and felt very stressed for the past month, correlating with the onset of these symptoms.  She denies dropping things when using her left side or falling.  However, she has been unable to work at [employer] where she was expecting a promotion for the past several weeks.   She has been recently started on Abilify and Prozac at [facility].  She feels her depressive symptoms are better, but she is still feeling very stressed.  Her appetite is increasing. She has a history of bulimia but she has not had any recent bulimic episodes recently and she feels like this issue is "under control".  Review of Systems Per HPI. Denies SI/HI  Past Medical History, Family History, Social History, Allergies, and Medications reviewed. History of anxiety, depression, bulimia, ?bipolar disorder, ?personality disorder    Objective:   Physical Exam GEN: NAD; well-nourished, well-appearing; overweight; accompanied by mother PSYCH: engaged, appropriate to questions, not depressed-appearing but mildly anxious CV: RRR MSK:   Left tricep/bicep and grip strength 4/5 compared to 5/5 on right   Left lower extremity 5/5   Coordination: normal finger to nose, finger-tapping slower on left   Gait: normal   Romberg: negative   Reflexes: 2+ bilaterally    Assessment & Plan:

## 2012-06-22 NOTE — Addendum Note (Signed)
Addended byArlyss Repress on: 06/22/2012 09:22 AM   Modules accepted: Orders

## 2012-06-22 NOTE — Assessment & Plan Note (Addendum)
I think this is contributing to her left-sided weakness. MRI negative. Being followed at [facility]. Next appointment 07/25. Started on Abilify and Prozac. Diagnosed with bipolar disorder, personality disorder this month during this exacerbation. Will request records.  Will start Ativan qd prn for symptoms. Discussed how this is to help above medications take affect. Will not give long-term. Follow-up in 1 month.

## 2012-06-22 NOTE — Patient Instructions (Addendum)
Try Ativan once a day as needed for severe anxiety.   Follow-up in 1 month.

## 2012-06-22 NOTE — Assessment & Plan Note (Signed)
See anxiety/depression AP 

## 2012-06-25 NOTE — Telephone Encounter (Signed)
Spoke with patient on 7/3 to let her know that results are wnl.  Pt to return for f/up as scheduled for further workup of symptoms.

## 2012-06-28 ENCOUNTER — Telehealth: Payer: Self-pay | Admitting: Family Medicine

## 2012-06-28 NOTE — Telephone Encounter (Signed)
Patient dropped off FMLA papers to be filled out.  Please mail to her home address when completed.

## 2012-07-01 NOTE — Telephone Encounter (Signed)
I have already filled out this paperwork. Anyeli said she was mailed the forms I had already filled out that I did not fill out completely.  She will bring these forms tomorrow to clinic.

## 2012-07-02 ENCOUNTER — Telehealth: Payer: Self-pay | Admitting: Family Medicine

## 2012-07-02 NOTE — Telephone Encounter (Signed)
FMLA papers to be completed by [facility].

## 2012-07-03 NOTE — Telephone Encounter (Signed)
Patient notified that FMLA papers are ready to pick up. Copy put in " to be scanned box "  Placed in front office file.

## 2012-07-03 NOTE — Telephone Encounter (Signed)
Completed and given to [facility].

## 2012-08-06 ENCOUNTER — Ambulatory Visit: Payer: Self-pay | Admitting: Family Medicine

## 2012-08-10 ENCOUNTER — Ambulatory Visit: Payer: Self-pay | Admitting: Family Medicine

## 2012-08-13 ENCOUNTER — Encounter: Payer: Self-pay | Admitting: Family Medicine

## 2012-08-13 ENCOUNTER — Ambulatory Visit (INDEPENDENT_AMBULATORY_CARE_PROVIDER_SITE_OTHER): Payer: Self-pay | Admitting: Family Medicine

## 2012-08-13 VITALS — BP 115/82 | HR 108 | Ht 65.0 in | Wt 172.0 lb

## 2012-08-13 DIAGNOSIS — F4481 Dissociative identity disorder: Secondary | ICD-10-CM | POA: Insufficient documentation

## 2012-08-13 DIAGNOSIS — F319 Bipolar disorder, unspecified: Secondary | ICD-10-CM

## 2012-08-13 NOTE — Progress Notes (Signed)
  Subjective:    Patient ID: Jeanette Nicholson, female    DOB: [birth year], 21 y.o.   MRN: [id number]  HPI # Bipolar disorder, depression flare; recently diagnosed with multiple personality disorder She is doing well, much better on new anti-anxiety medication. She does not remember the name. She wants to return to work.   Review of Systems Denies SI/HI    Objective:   Physical Exam GEN: NAD PSYCH: appears much more alert; pleasant; engaged; appropriate to questions; fully alert and oriented    Assessment & Plan:

## 2012-08-13 NOTE — Assessment & Plan Note (Signed)
She appears much better today. Paperwork signed for her to return to work early September. Follow-up as needed. She is being closely followed at [facility]. Asked her to call and let me know which new medication they put her on. Currently she is taking that one and Prozac.

## 2012-10-10 ENCOUNTER — Ambulatory Visit (INDEPENDENT_AMBULATORY_CARE_PROVIDER_SITE_OTHER): Payer: Self-pay | Admitting: Family Medicine

## 2012-10-10 ENCOUNTER — Encounter: Payer: Self-pay | Admitting: Family Medicine

## 2012-10-10 VITALS — BP 108/73 | HR 94 | Temp 98.0°F | Ht 65.0 in | Wt 173.0 lb

## 2012-10-10 DIAGNOSIS — M545 Low back pain, unspecified: Secondary | ICD-10-CM

## 2012-10-10 DIAGNOSIS — Z23 Encounter for immunization: Secondary | ICD-10-CM

## 2012-10-10 DIAGNOSIS — F319 Bipolar disorder, unspecified: Secondary | ICD-10-CM

## 2012-10-10 DIAGNOSIS — M549 Dorsalgia, unspecified: Secondary | ICD-10-CM

## 2012-10-10 NOTE — Assessment & Plan Note (Signed)
I suspect psychogenic component.  Will check x-ray per patient and mother's request due to "family history of bone problems" but more because of midline tenderness to rule-out fracture and over spinal stenosis.  Discussed how psychogenic component may be contributing but informed will rule-out bony process.  Follow-up in 1 week or sooner if needed. Red flags given.  Asked patient to call and let me know which medications she is taking.

## 2012-10-10 NOTE — Progress Notes (Signed)
  Subjective:    Patient ID: Jeanette Nicholson, female    DOB: April 16, 1991, 21 y.o.   MRN: [id number]  HPI # Worsened back pain for the past 1-4 weeks The patient says it has been 4 weeks, and her mother thinks it has been a month.  She has chronic back pain, ever since she was "really young". It has gotten worse and now she is having to walk with a cane or use a wheelchair due to leg weakness.  She denies saddle anesthesia or urinary/stool incontinence.   Meds tried: OTC analgesics (Tylenol ibuprofen), muscle relaxant (she is not sure the name) did not help. Warm compresses on her back make the pain worse.   Review of Systems Per HPI Endorses sometimes radiation down left leg to popliteal space  Allergies, medication, past medical history reviewed.  Bipolar disorder/schizophrenia, multiple personality disorder--she does not remember the name of the medications she is on besides Prozac. She was recently seen at [facility] last week. No medication changes were made at that time.    Objective:   Physical Exam GEN: NAD; well-nourished, -appearing PSYCH: engaged, appropriate to questions, alert and oriented, pleasant CV: RRR, normal S1/S2, no murmurs/gallops PULM: NI WOB MSK: BACK    Mid-line lumbar and point bilateral paraspinal/sacroiliac tenderness; no bruises, erythema, edema, or other visual abnormality   Flexion normal, extension 30 deg, lateral flexion 30 deg   Sensation intact in legs   Strength: 4/ strength flexion and extension of knees and toes   Negative SLR   GAIT: walks hunched forward, able to walk without cane that she brought from home, slow, antalgic bilaterally   Reflex: 2+ popliteal and Achilles; no clonus    Assessment & Plan:

## 2012-10-10 NOTE — Patient Instructions (Addendum)
Please get an x-ray at your convenience over at [facility] I will call you with the results in 1-2 days  Please call and let us know which medications you are on   Please make an appointment for follow-up in 1-2 weeks  Please make an appointment for Implanon

## 2012-10-11 ENCOUNTER — Ambulatory Visit (HOSPITAL_COMMUNITY)
Admission: RE | Admit: 2012-10-11 | Discharge: 2012-10-11 | Disposition: A | Discharge status: Home / Self Care | Payer: Self-pay | Source: Ambulatory Visit | Attending: Family Medicine | Admitting: Family Medicine

## 2012-10-11 DIAGNOSIS — M79609 Pain in unspecified limb: Secondary | ICD-10-CM | POA: Insufficient documentation

## 2012-10-11 DIAGNOSIS — M549 Dorsalgia, unspecified: Secondary | ICD-10-CM

## 2012-10-11 DIAGNOSIS — M545 Low back pain, unspecified: Secondary | ICD-10-CM | POA: Insufficient documentation

## 2012-10-11 DIAGNOSIS — F319 Bipolar disorder, unspecified: Secondary | ICD-10-CM

## 2012-10-11 LAB — COMPREHENSIVE METABOLIC PANEL
Albumin: 4.1 g/dL (ref 3.5–5.2)
Alkaline Phosphatase: 71 U/L (ref 39–117)
BUN: 10 mg/dL (ref 6–23)
CO2: 23 mEq/L (ref 19–32)
Calcium: 9 mg/dL (ref 8.4–10.5)
Chloride: 109 mEq/L (ref 96–112)
Glucose, Bld: 74 mg/dL (ref 70–99)
Potassium: 4 mEq/L (ref 3.5–5.3)
Sodium: 140 mEq/L (ref 135–145)
Total Protein: 6.8 g/dL (ref 6.0–8.3)

## 2012-10-12 ENCOUNTER — Telehealth: Payer: Self-pay | Admitting: Family Medicine

## 2012-10-12 DIAGNOSIS — R109 Unspecified abdominal pain: Secondary | ICD-10-CM

## 2012-10-12 NOTE — Telephone Encounter (Signed)
Patient is having RUQ abdominal pain without fevers or nausea Discussed lumbar x-ray results showing density concerning for cholelithiasis and renal mass Patient would like recommend follow-up ultrasound to evaluate this.

## 2012-10-15 MED ORDER — TRAMADOL HCL 50 MG PO TABS: 50.0000 mg | ORAL_TABLET | Freq: Three times a day (TID) | ORAL | Status: DC | PRN

## 2012-10-15 NOTE — Telephone Encounter (Signed)
Please notify patient Rx called in for Tramadol to [facility] on [address]. Will you please also ask which psychiatric medications she is on? Thank you.

## 2012-10-15 NOTE — Telephone Encounter (Signed)
Called and LMOVM for pt to call back.  Please ask her if she is taking the prozac and if there are any other meds she is taking from [facility].  And please let her know about the tramadol. Fleeger, Maryjo Rochester

## 2012-10-15 NOTE — Telephone Encounter (Signed)
Appt made for 10.31 @ 8:30am.  Mom informed and agreeable.  Also pt is asking for pain meds for the pain in her back.  Advised I would forward message to MD to see if she wants to send something in. Fleeger, Maryjo Rochester

## 2012-10-15 NOTE — Addendum Note (Signed)
Addended by: Madolyn Frieze, Marylene Land J on: 10/15/2012 01:50 PM   Modules accepted: Orders

## 2012-10-16 NOTE — Telephone Encounter (Signed)
Pt called back and message was given - she is taking Prozac, hydrooxyzine, topiramate

## 2012-10-17 ENCOUNTER — Ambulatory Visit: Payer: Self-pay | Admitting: Family Medicine

## 2012-10-18 ENCOUNTER — Other Ambulatory Visit: Payer: Self-pay | Admitting: Family Medicine

## 2012-10-18 ENCOUNTER — Ambulatory Visit (HOSPITAL_COMMUNITY)
Admission: RE | Admit: 2012-10-18 | Discharge: 2012-10-18 | Disposition: A | Discharge status: Home / Self Care | Payer: Self-pay | Source: Ambulatory Visit | Attending: Family Medicine | Admitting: Family Medicine

## 2012-10-18 DIAGNOSIS — K8020 Calculus of gallbladder without cholecystitis without obstruction: Secondary | ICD-10-CM | POA: Insufficient documentation

## 2012-10-18 DIAGNOSIS — R109 Unspecified abdominal pain: Secondary | ICD-10-CM | POA: Insufficient documentation

## 2012-10-19 ENCOUNTER — Encounter: Payer: Self-pay | Admitting: Family Medicine

## 2012-10-19 ENCOUNTER — Ambulatory Visit (INDEPENDENT_AMBULATORY_CARE_PROVIDER_SITE_OTHER): Payer: Self-pay | Admitting: Family Medicine

## 2012-10-19 VITALS — BP 124/81 | HR 105 | Temp 98.7°F | Ht 65.0 in | Wt 172.0 lb

## 2012-10-19 DIAGNOSIS — Z309 Encounter for contraceptive management, unspecified: Secondary | ICD-10-CM

## 2012-10-19 DIAGNOSIS — IMO0001 Reserved for inherently not codable concepts without codable children: Secondary | ICD-10-CM

## 2012-10-19 DIAGNOSIS — N2889 Other specified disorders of kidney and ureter: Secondary | ICD-10-CM

## 2012-10-19 DIAGNOSIS — M545 Low back pain, unspecified: Secondary | ICD-10-CM

## 2012-10-19 DIAGNOSIS — N289 Disorder of kidney and ureter, unspecified: Secondary | ICD-10-CM

## 2012-10-19 NOTE — Patient Instructions (Addendum)
Follow-up with me after you see your psychiatrist or sooner if you have worrisome symptoms

## 2012-10-21 DIAGNOSIS — IMO0001 Reserved for inherently not codable concepts without codable children: Secondary | ICD-10-CM | POA: Insufficient documentation

## 2012-10-21 NOTE — Assessment & Plan Note (Signed)
It is unchanged according to the patient and her mother. On physical exam, she remains weak (4/5) and has diffuse lumbar tenderness.  Imaging studies (lumbar x-ray and abdominal ultrasound) has revealed a hyperechoic right kidney lesion and gallstones without signs or symptoms of cholecystitis.  We will check an MRI of her abdomen and lumbar spine to rule-out renal neoplastic disease (per ultrasound recommendations) and to evaluate her lower spine due to persistence of pain and weakness.  The patient's mother expressed concern about MS, however, her June 2013 MRI did not have any comments suggesting this diagnosis. She has an appointment with her psychiatrist in a few weeks. She will follow-up after MRI to discuss results and for re-evaluation.

## 2012-10-21 NOTE — Assessment & Plan Note (Signed)
After discussing birth control options, she will continue OCPs. She may consider Nuvaring if she can afford at the pharmacy; she will ask for prices and let me know if she is interested in this.

## 2012-10-21 NOTE — Progress Notes (Signed)
  Subjective:    Patient ID: Jeanette Nicholson, female    DOB: 19-Apr-1991, 21 y.o.   MRN: [id number]  HPI # She was scheduled to get Nexplanon but she has the orange card which does not cover this contraception.  # Her back pain is about the same but the pain is tolerable. It is the weakness that bothers her and her mother who is present with her. She is using her cane today.  We discussed her ultrasound results showing gallstones and a hyperechoic structure on her right kidney. She denies nausea/vomiting/decreased appetite/fevers/chills. She also denies dysuria or hematuria. She does endorse abdominal pain occasionally but not at this time.   Review of Systems Per HPI Denies saddle anesthesia, urinary/stool incontinence, fever  Allergies, medication, past medical history reviewed.      Objective:   Physical Exam GEN: NAD; well-appearing PSYCH: engaged, appropriate to questions, alert and oriented, pleasant MSK: BACK   No visual abnormalities   Diffuse tenderness mid-line and paraspinal/sacroiliac area   ROM: flexion normal, extension 30 deg, lateral flexion 30 deg    Sensation intact in legs    Strength: 4/5 strength hips, knees, and feet   Reflexes: 2+ popliteal, Achilles GAIT: walks slowly but non-antalgic and no longer hunched; able to walk without cane opliteal and Achilles; no clonus    Assessment & Plan:

## 2012-10-22 ENCOUNTER — Telehealth: Payer: Self-pay | Admitting: *Deleted

## 2012-10-22 NOTE — Addendum Note (Signed)
Addended by: Garen Grams F on: 10/22/2012 10:20 AM   Modules accepted: Orders

## 2012-10-22 NOTE — Telephone Encounter (Signed)
Message copied by Aram Beecham on Mon Oct 22, 2012 10:20 AM ------      Message from: [facility] [place] J      Created: Sun Oct 21, 2012  4:18 PM       Please schedule MRI of lumbar spine to evaluate spine and also I want MRI of abdomen to evaluate right kidney mass (ultrasound recommended MRI) but this was not an option, so please mention MRI of right kidney and spine when scheduling. Thank you!

## 2012-10-22 NOTE — Telephone Encounter (Signed)
Tried calling patient to inform her of MRI scheduled 11/12 at 8:45am at [facility]. No answer or voicemail, will try again later.

## 2012-10-24 ENCOUNTER — Telehealth: Payer: Self-pay | Admitting: *Deleted

## 2012-10-24 MED ORDER — LORAZEPAM 2 MG PO TABS: ORAL_TABLET | ORAL | Status: DC

## 2012-10-24 NOTE — Telephone Encounter (Signed)
Renard Hamper, will you call in Rx for Ativan as documented please?

## 2012-10-24 NOTE — Telephone Encounter (Signed)
Spoke with patient and her mother regarding MRI. They state that patient is claustrophobic and will need something called in prior to her exam which is scheduled for 11/10 at 8AM. Patient uses [facility]. Message to MD.

## 2012-10-24 NOTE — Telephone Encounter (Signed)
Patient informed of appointment details.

## 2012-10-26 ENCOUNTER — Other Ambulatory Visit: Payer: Self-pay | Admitting: Family Medicine

## 2012-10-26 DIAGNOSIS — N2889 Other specified disorders of kidney and ureter: Secondary | ICD-10-CM

## 2012-10-26 NOTE — Telephone Encounter (Signed)
Rx called in 

## 2012-10-28 ENCOUNTER — Ambulatory Visit (HOSPITAL_COMMUNITY): Admission: RE | Admit: 2012-10-28 | Payer: Self-pay | Source: Ambulatory Visit

## 2012-10-28 ENCOUNTER — Ambulatory Visit (HOSPITAL_COMMUNITY)
Admission: RE | Admit: 2012-10-28 | Discharge: 2012-10-28 | Disposition: A | Discharge status: Home / Self Care | Payer: Self-pay | Source: Ambulatory Visit | Attending: Family Medicine | Admitting: Family Medicine

## 2012-10-28 DIAGNOSIS — N2889 Other specified disorders of kidney and ureter: Secondary | ICD-10-CM

## 2012-10-28 DIAGNOSIS — N289 Disorder of kidney and ureter, unspecified: Secondary | ICD-10-CM | POA: Insufficient documentation

## 2012-10-28 DIAGNOSIS — M549 Dorsalgia, unspecified: Secondary | ICD-10-CM | POA: Insufficient documentation

## 2012-10-28 DIAGNOSIS — K8020 Calculus of gallbladder without cholecystitis without obstruction: Secondary | ICD-10-CM | POA: Insufficient documentation

## 2012-10-28 DIAGNOSIS — R5381 Other malaise: Secondary | ICD-10-CM | POA: Insufficient documentation

## 2012-10-28 MED ORDER — GADOBENATE DIMEGLUMINE 529 MG/ML IV SOLN
16.0000 mL | Freq: Once | INTRAVENOUS | Status: AC | PRN
  Administered 2012-10-28: 16 mL via INTRAVENOUS

## 2012-10-30 ENCOUNTER — Ambulatory Visit (HOSPITAL_COMMUNITY): Admission: RE | Admit: 2012-10-30 | Payer: Self-pay | Source: Ambulatory Visit

## 2012-10-30 ENCOUNTER — Other Ambulatory Visit (HOSPITAL_COMMUNITY): Payer: Self-pay

## 2012-10-30 ENCOUNTER — Encounter: Payer: Self-pay | Admitting: Family Medicine

## 2012-10-30 ENCOUNTER — Telehealth: Payer: Self-pay | Admitting: Family Medicine

## 2012-10-30 DIAGNOSIS — M545 Low back pain, unspecified: Secondary | ICD-10-CM

## 2012-10-30 NOTE — Assessment & Plan Note (Signed)
Documentation only. I spoke with radiologist regarding MRI brain for possible signs of MS--none seen.

## 2012-10-30 NOTE — Telephone Encounter (Signed)
I will send a letter asking her follow-up with me to discuss results and for follow-up of her back pain/leg weakness

## 2012-11-01 ENCOUNTER — Ambulatory Visit (INDEPENDENT_AMBULATORY_CARE_PROVIDER_SITE_OTHER): Payer: Self-pay | Admitting: Family Medicine

## 2012-11-01 ENCOUNTER — Encounter: Payer: Self-pay | Admitting: Family Medicine

## 2012-11-01 VITALS — BP 106/74 | HR 98 | Ht 65.0 in | Wt 169.0 lb

## 2012-11-01 DIAGNOSIS — M545 Low back pain, unspecified: Secondary | ICD-10-CM

## 2012-11-01 DIAGNOSIS — N200 Calculus of kidney: Secondary | ICD-10-CM | POA: Insufficient documentation

## 2012-11-01 DIAGNOSIS — R5381 Other malaise: Secondary | ICD-10-CM

## 2012-11-01 DIAGNOSIS — R531 Weakness: Secondary | ICD-10-CM

## 2012-11-01 DIAGNOSIS — K8020 Calculus of gallbladder without cholecystitis without obstruction: Secondary | ICD-10-CM | POA: Insufficient documentation

## 2012-11-01 NOTE — Assessment & Plan Note (Signed)
MRI shows kidney stones, however, her back pain is unlikely due to just this. We will not do any active management of kidney stones at this time.

## 2012-11-01 NOTE — Patient Instructions (Addendum)
I will call you with lab results  Follow-up in 2 weeks

## 2012-11-01 NOTE — Assessment & Plan Note (Signed)
This is unlikely the reason for her diffuse symptoms at this time, so we will not do any active management of this issue currently.

## 2012-11-01 NOTE — Progress Notes (Signed)
  Subjective:    Patient ID: Jeanette Nicholson, female    DOB: 09-22-1991, 21 y.o.   MRN: [id number]  HPI # She is accompanied by her parents today and her father who I am meeting for the first time feels like she is "going downhill". She continues to be weak "all over" especially in her legs and needs to use a cane sometimes (she is not using one today), she sometimes slurs her speech and shakes. The patient is most concerned about her memory and that she has a difficult time remembering parts of her day or details.   She says she has felt like this in the past before starting her medications for schizophrenia/bipolar/personality disorder.  She reports compliance with current medications, however.   Review of Systems Denies SI/HI, depression, changes in sleep, decreased or change in appetite.  Denies numbness or urine/stool incontinence. Denies blood in urine or dysuria Denies SOB  Allergies, medication, past medical history reviewed.   MRI of L-spine and abdomen reviewed and discussed with patient.     Objective:   Physical Exam GEN: NAD; appears tired PSYCH: completely appropriate to questions; alert and oriented x 4; pleasant CV: RRR, normal S1/S2, no murmurs PULM: NI WOB; CTAB  NEURO: 3-4/5 strength all extremities; hyperreflexic patellar reflexes bilaterally but Achilles and upper extremity reflexes 2+ and no clonus; sensation intact; gait slow but normal; negative SLR; tenderness along almost entire lumbar spine and paraspinous/sarcoiliac areas diffusely    Assessment & Plan:

## 2012-11-01 NOTE — Assessment & Plan Note (Signed)
Etiology remains unclear.  We have ruled-out most organic processes. We will check CK, ANA, ESR, TSH, CMET, RPR for comprehensive evaluation.  We discussed with patient and parents that we may not not find organic cause, that psychiatric disease may be biggest contributor. They expressed understanding and appreciation. Patient has an appointment with Darl Pikes at [facility] 11/06/2012. I will send copy of my note to her, and I have requested patient ask that OV note to be sent to me.

## 2012-11-02 LAB — BASIC METABOLIC PANEL
CO2: 20 mEq/L (ref 19–32)
Calcium: 9.5 mg/dL (ref 8.4–10.5)
Glucose, Bld: 77 mg/dL (ref 70–99)
Potassium: 3.7 mEq/L (ref 3.5–5.3)
Sodium: 136 mEq/L (ref 135–145)

## 2012-11-02 LAB — CK: Total CK: 55 U/L (ref 7–177)

## 2012-11-02 LAB — TSH: TSH: 1.26 u[IU]/mL (ref 0.350–4.500)

## 2012-11-05 ENCOUNTER — Telehealth: Payer: Self-pay | Admitting: Family Medicine

## 2012-11-05 DIAGNOSIS — M545 Low back pain, unspecified: Secondary | ICD-10-CM

## 2012-11-05 NOTE — Telephone Encounter (Signed)
Note, lab, and phone note faxed to [phone] Attn : susan B

## 2012-11-05 NOTE — Telephone Encounter (Signed)
Normal lab results. Called and discussed with patient. Will send to Susan at [facility]. Patient and mother is wondering if would benefit change in psychiatric medications. I advised patient to try any changes if recommended; if back pain persists, advised patient to follow-up to discuss.  She is amenable to plan.   

## 2012-11-05 NOTE — Assessment & Plan Note (Signed)
Normal lab results. Called and discussed with patient. Will send to Darl Pikes at [place]. Patient and mother is wondering if would benefit change in psychiatric medications. I advised patient to try any changes if recommended; if back pain persists, advised patient to follow-up to discuss.  She is amenable to plan.

## 2012-11-21 ENCOUNTER — Ambulatory Visit (INDEPENDENT_AMBULATORY_CARE_PROVIDER_SITE_OTHER): Payer: No Typology Code available for payment source | Admitting: Family Medicine

## 2012-11-21 DIAGNOSIS — M545 Low back pain, unspecified: Secondary | ICD-10-CM

## 2012-11-21 DIAGNOSIS — H6120 Impacted cerumen, unspecified ear: Secondary | ICD-10-CM

## 2012-11-21 MED ORDER — CARBAMIDE PEROXIDE 6.5 % OT SOLN: 5.0000 [drp] | Freq: Two times a day (BID) | OTIC | Status: DC

## 2012-11-21 MED ORDER — GABAPENTIN 300 MG PO CAPS: 300.0000 mg | ORAL_CAPSULE | Freq: Three times a day (TID) | ORAL | Status: DC

## 2012-11-21 NOTE — Patient Instructions (Addendum)
Try medication for your back pain Follow-up in 1 week.   Try ear drops to help with wax. You have wax in both ears so use in both ears.  Follow-up as needed if you want wax washed out or let me know if you want referral to ear nose throat doctor to get it curetted out.

## 2012-11-21 NOTE — Progress Notes (Signed)
  Subjective:    Patient ID: Jeanette Nicholson, female    DOB: Dec 29, 1990, 21 y.o.   MRN: [id number]  HPI # Decreased hearing right ear that happened after she used Q-tip in that ear. Denies ear discharge or pain.   # Persistent lower back pain Her other symptoms (difficulty ambulating, staring spells) have since improved and only occurs intermittently.  She denies problems with walking today.  Her back pain is over her spine and does not radiate. She rates pain quality as severe. She has difficulty sleeping at nighttime due to pain. She denies pain, fever, chills, nausea. The pain is persistent but not worsening. It is sharp. She denies numbness in legs, urine/bowel incontinence.  Exacerbated by: nothing Alleviated by: nothing Meds tried: OTC analgesics did not provide any relief   Review of Systems Denies hematuria, dysuria/frequency/urgency Allergies, medication, past medical history reviewed.      Objective:   Physical Exam GEN: NAD; well-nourished, -appearing PSYCH: pleasant; not manic or depressed-appearing EAR: cerumen impaction right ear; partial impaction left ear  BACK:    No visual abnormalities   Tenderness along lower lumbar spine without paraspinal tenderness   Negative SLR bilaterally   Negative Faber's, good movement with hip rocking, 5/5 strength, sensation intact, 3+ patellar reflexes but 1-2+ Achilles reflexes    Assessment & Plan:

## 2012-11-21 NOTE — Assessment & Plan Note (Signed)
We discussed rinsing, ENT referral for currette, Debrox. She would like to try Debrox for now.

## 2012-11-21 NOTE — Assessment & Plan Note (Addendum)
Most of her symptoms have improved except for back pain. MRI L-spine reviewed with patient and father today. She does not present with radiculopathic symptoms, however, due to how distressing lower back pain remains, we will try gabapentin. Risks and side effects discussed with patient.  Other D/Dx: nephrolithiasis, psychogenic  Follow-up in 1-2 weeks.

## 2013-05-15 ENCOUNTER — Observation Stay (HOSPITAL_COMMUNITY)
Admission: EM | Admit: 2013-05-15 | Discharge: 2013-05-17 | Disposition: A | Discharge status: Home / Self Care | Payer: No Typology Code available for payment source | Attending: General Surgery | Admitting: General Surgery

## 2013-05-15 ENCOUNTER — Emergency Department (HOSPITAL_COMMUNITY): Payer: No Typology Code available for payment source

## 2013-05-15 ENCOUNTER — Encounter (HOSPITAL_COMMUNITY): Payer: Self-pay

## 2013-05-15 DIAGNOSIS — R109 Unspecified abdominal pain: Secondary | ICD-10-CM

## 2013-05-15 DIAGNOSIS — R632 Polyphagia: Secondary | ICD-10-CM | POA: Insufficient documentation

## 2013-05-15 DIAGNOSIS — R7401 Elevation of levels of liver transaminase levels: Secondary | ICD-10-CM | POA: Diagnosis present

## 2013-05-15 DIAGNOSIS — R1011 Right upper quadrant pain: Secondary | ICD-10-CM

## 2013-05-15 DIAGNOSIS — K8010 Calculus of gallbladder with chronic cholecystitis without obstruction: Principal | ICD-10-CM | POA: Insufficient documentation

## 2013-05-15 DIAGNOSIS — D649 Anemia, unspecified: Secondary | ICD-10-CM | POA: Insufficient documentation

## 2013-05-15 DIAGNOSIS — F329 Major depressive disorder, single episode, unspecified: Secondary | ICD-10-CM | POA: Insufficient documentation

## 2013-05-15 DIAGNOSIS — F3289 Other specified depressive episodes: Secondary | ICD-10-CM | POA: Insufficient documentation

## 2013-05-15 DIAGNOSIS — F411 Generalized anxiety disorder: Secondary | ICD-10-CM | POA: Insufficient documentation

## 2013-05-15 DIAGNOSIS — R7402 Elevation of levels of lactic acid dehydrogenase (LDH): Secondary | ICD-10-CM | POA: Insufficient documentation

## 2013-05-15 DIAGNOSIS — K8020 Calculus of gallbladder without cholecystitis without obstruction: Secondary | ICD-10-CM

## 2013-05-15 LAB — COMPREHENSIVE METABOLIC PANEL
ALT: 361 U/L — ABNORMAL HIGH (ref 0–35)
AST: 328 U/L — ABNORMAL HIGH (ref 0–37)
Albumin: 3 g/dL — ABNORMAL LOW (ref 3.5–5.2)
Calcium: 8.9 mg/dL (ref 8.4–10.5)
GFR calc Af Amer: >90 mL/min (ref >90)
Sodium: 136 mEq/L (ref 135–145)
Total Protein: 7.2 g/dL (ref 6.0–8.3)

## 2013-05-15 LAB — CBC WITH DIFFERENTIAL/PLATELET
Basophils Relative: 3 % — ABNORMAL HIGH (ref 0–1)
Eosinophils Relative: 2 % (ref 0–5)
HCT: 35.2 % — ABNORMAL LOW (ref 36.0–46.0)
Hemoglobin: 11.9 g/dL — ABNORMAL LOW (ref 12.0–15.0)
Lymphocytes Relative: 60 % — ABNORMAL HIGH (ref 12–46)
MCHC: 33.8 g/dL (ref 30.0–36.0)
MCV: 84.8 fL (ref 78.0–100.0)
Monocytes Absolute: 1.1 10*3/uL — ABNORMAL HIGH (ref 0.1–1.0)
Monocytes Relative: 10 % (ref 3–12)
Neutro Abs: 3.1 10*3/uL (ref 1.7–7.7)

## 2013-05-15 LAB — LIPASE, BLOOD: Lipase: 41 U/L (ref 11–59)

## 2013-05-15 LAB — URINALYSIS, ROUTINE W REFLEX MICROSCOPIC
Nitrite: NEGATIVE
Specific Gravity, Urine: 1.031 — ABNORMAL HIGH (ref 1.005–1.030)
Urobilinogen, UA: 1 mg/dL (ref 0.0–1.0)
pH: 5 (ref 5.0–8.0)

## 2013-05-15 LAB — PREGNANCY, URINE: Preg Test, Ur: NEGATIVE

## 2013-05-15 MED ORDER — ONDANSETRON HCL 4 MG/2ML IJ SOLN
4.0000 mg | Freq: Once | INTRAMUSCULAR | Status: AC
  Administered 2013-05-15: 4 mg via INTRAVENOUS
  Filled 2013-05-15: qty 2

## 2013-05-15 MED ORDER — MORPHINE SULFATE 4 MG/ML IJ SOLN
4.0000 mg | Freq: Once | INTRAMUSCULAR | Status: AC
  Administered 2013-05-15: 4 mg via INTRAVENOUS
  Filled 2013-05-15: qty 1

## 2013-05-15 NOTE — ED Notes (Signed)
Patient transported to Ultrasound 

## 2013-05-15 NOTE — ED Notes (Signed)
MD at bedside. 

## 2013-05-15 NOTE — ED Notes (Signed)
Returned from ultrasound.

## 2013-05-15 NOTE — ED Provider Notes (Signed)
History     CSN: [id number]  Arrival date & time 05/15/13  4098   First MD Initiated Contact with Patient 05/15/13 1935      Chief Complaint  Patient presents with  . Abdominal Pain    (Consider location/radiation/quality/duration/timing/severity/associated sxs/prior treatment) HPI  Patient is a 22 year old female past medical history significant for history of cholelithiasis and kidney stones presenting to the ED for four days of worsening waxing and waning burning RUQ abdominal pain w/ radiation to back and associated nausea, vomiting, subjective fevers, chills, and decreased PO intake. Patient states she had a history of gallstones last year and had not followed up with the general surgeon as advised.    Past Medical History  Diagnosis Date  . Anxiety   . Depression   . Bulimia     History reviewed. No pertinent past surgical history.  Family History  Problem Relation Age of Onset  . Depression Mother   . Mental illness Mother     History  Substance Use Topics  . Smoking status: Never Smoker   . Smokeless tobacco: Not on file  . Alcohol Use: No    OB History   Grav Para Term Preterm Abortions TAB SAB Ect Mult Living                  Review of Systems  Constitutional: Positive for fever and chills.  HENT: Negative.   Eyes: Negative.   Respiratory: Negative for shortness of breath.   Cardiovascular: Negative for chest pain.  Gastrointestinal: Positive for nausea, vomiting and abdominal pain.  Genitourinary: Negative for dysuria.  Musculoskeletal: Positive for back pain.  Skin: Negative.   Neurological: Negative for headaches.    Allergies  Review of patient's allergies indicates no known allergies.  Home Medications   No current outpatient prescriptions on file.  BP 97/64  Pulse 71  Temp(Src) 98.1 F (36.7 C) (Oral)  Resp 18  Ht 5\' 5"  (1.651 m)  Wt 182 lb (82.555 kg)  BMI 30.29 kg/m2  SpO2 97%  LMP 04/25/2013  Physical Exam   Constitutional: She is oriented to person, place, and time. She appears well-developed and well-nourished. No distress.  HENT:  Head: Normocephalic and atraumatic.  Eyes: Conjunctivae are normal.  Neck: Neck supple.  Cardiovascular: Normal rate, regular rhythm and normal heart sounds.   Pulmonary/Chest: Effort normal and breath sounds normal. No respiratory distress.  Abdominal: Soft. Bowel sounds are normal. There is tenderness in the right upper quadrant. There is positive Murphy's sign. There is no rigidity and no guarding.  Neurological: She is alert and oriented to person, place, and time.  Skin: Skin is warm and dry. She is not diaphoretic.  Psychiatric: She has a normal mood and affect.    ED Course  Procedures (including critical care time)  Medications  FLUoxetine (PROZAC) capsule 20 mg (20 mg Oral Not Given 05/16/13 1000)  hydrOXYzine (ATARAX/VISTARIL) tablet 25 mg (not administered)  gabapentin (NEURONTIN) capsule 300 mg (300 mg Oral Not Given 05/16/13 1000)  topiramate (TOPAMAX) tablet 100 mg (100 mg Oral Not Given 05/16/13 0132)  dextrose 5 % and 0.9 % NaCl with KCl 20 mEq/L infusion ( Intravenous New Bag/Given 05/16/13 0139)  morphine 4 MG/ML injection 4 mg (4 mg Intravenous Given 05/16/13 0138)  ondansetron (ZOFRAN) injection 4 mg (not administered)  pantoprazole (PROTONIX) injection 40 mg (40 mg Intravenous Given 05/16/13 0138)  ceFAZolin (ANCEF) IVPB 2 g/50 mL premix (not administered)  LORazepam (ATIVAN) injection 0.5 mg (0.5 mg  Intravenous Given 05/16/13 0906)  morphine 4 MG/ML injection 4 mg (4 mg Intravenous Given 05/15/13 2045)  ondansetron (ZOFRAN) injection 4 mg (4 mg Intravenous Given 05/15/13 2043)  sodium chloride 0.9 % bolus 1,000 mL (0 mLs Intravenous Duplicate 05/16/13 0132)    Labs Reviewed  CBC WITH DIFFERENTIAL - Abnormal; Notable for the following:    WBC 11.5 (*)    Hemoglobin 11.9 (*)    HCT 35.2 (*)    Neutrophils Relative % 27 (*)    Lymphocytes  Relative 60 (*)    Lymphs Abs 6.8 (*)    Monocytes Absolute 1.1 (*)    Basophils Relative 3 (*)    Basophils Absolute 0.3 (*)    All other components within normal limits  URINALYSIS, ROUTINE W REFLEX MICROSCOPIC - Abnormal; Notable for the following:    Color, Urine AMBER (*)    APPearance CLOUDY (*)    Specific Gravity, Urine 1.031 (*)    Bilirubin Urine SMALL (*)    Ketones, ur 15 (*)    Leukocytes, UA MODERATE (*)    All other components within normal limits  COMPREHENSIVE METABOLIC PANEL - Abnormal; Notable for the following:    CO2 17 (*)    Albumin 3.0 (*)    AST 328 (*)    ALT 361 (*)    Alkaline Phosphatase 231 (*)    All other components within normal limits  URINE MICROSCOPIC-ADD ON - Abnormal; Notable for the following:    Squamous Epithelial / LPF FEW (*)    Bacteria, UA MANY (*)    All other components within normal limits  COMPREHENSIVE METABOLIC PANEL - Abnormal; Notable for the following:    Calcium 8.2 (*)    Albumin 2.6 (*)    AST 285 (*)    ALT 310 (*)    Alkaline Phosphatase 197 (*)    All other components within normal limits  CBC - Abnormal; Notable for the following:    RBC 3.58 (*)    Hemoglobin 9.9 (*)    HCT 30.9 (*)    All other components within normal limits  SURGICAL PCR SCREEN  URINE CULTURE  LIPASE, BLOOD  PREGNANCY, URINE  PATHOLOGIST SMEAR REVIEW   Pt still tender in RUQ after pain management.  General surgery consulted and will be by to evaluate patient.   US Abdomen Complete  05/15/2013   *RADIOLOGY REPORT*  Clinical Data:  Abdominal pain  COMPLETE ABDOMINAL ULTRASOUND  Comparison:  Prior abdominal MRI 10/28/2012; prior abdominal ultrasound 10/18/2012  Findings:  Gallbladder:  Multiple mobile echogenic shadowing foci consistent with cholelithiasis.  Per the sonographer, the sonographic Murphy's sign was positive.  No pericholecystic fluid.  No gallbladder wall thickening.  Common bile duct:  Within normal limits at less than 3 mm in  caliber.  Liver:  No focal lesion identified.  Within normal limits in parenchymal echogenicity.  IVC:  Appears normal.  Pancreas:  No focal abnormality seen.  Spleen:  Sonographically unremarkable.  9.7 cm in length.  Right Kidney:  Right kidney measures 9.7 cm and length.  There is a small cluster of stones in the interpolar region consistent with the prior MRI findings.  No focal solid lesion.  No hydronephrosis.  Left Kidney:  Normal reniform contour without hydronephrosis or nephrolithiasis.  10 cm in length.  Abdominal aorta:  No aneurysm identified.  IMPRESSION:  1.  Cholelithiasis.  Although the sonographic Murphy's sign was positive, there is no gallbladder wall thickening or pericholecystic fluid.  Sonographic findings are suggestive of but not diagnostic of acute cholecystitis.  Chronic cholecystitis is a differential consideration.  If additional imaging is clinically warranted, nuclear medicine HIDA scan could confirm patency or obstruction of the cystic duct.  2.  Cluster of small stones in the mid right kidney as seen on prior MRI.   Original Report Authenticated By: Malachy Moan, M.D.     No diagnosis found.    MDM  Pt presented w/ four days of worsening RUQ pain w/ nausea and vomiting. Abdomen soft w/ RUQ tenderness and positive Murphy's sign. Vitals, labs, and imaging all reviewed. Pain managed in ED, but pt still mildly tender in RUQ. Given elevated AST, ALT, ALP w/ presence of gallstones on ultrasound and persistent tenderness, general surgery was consulted. General surgery will admit patient. The patient appears reasonably stabilized for admission considering the current resources, flow, and capabilities available in the ED at this time, and I doubt any other [facility] requiring further screening and/or treatment in the ED prior to admission.Patient d/w with Dr. Rubin Payor, agrees with plan. Patient is stable at time of transport.           Lise Auer Nasiyah Laverdiere, PA-C 05/16/13  1018

## 2013-05-15 NOTE — H&P (Signed)
Jeanette Nicholson is an 22 y.o. female.   Chief Complaint: abdominal pain HPI: The pt is a 21 yo wf who has known gallstones. She has been feeling bad for the last year. Her pain is getting worse over the last 2 weeks. She has had significant nausea and vomiting. She has been complaining of right upper quadrant pain. She feels very dehydrated. She came to the emergency room for more another ultrasound continued to show stones in the gallbladder but no gallbladder wall thickening or ductal dilatation.  Past Medical History  Diagnosis Date  . Anxiety   . Depression   . Bulimia     History reviewed. No pertinent past surgical history.  Family History  Problem Relation Age of Onset  . Depression Mother   . Mental illness Mother    Social History:  reports that she has never smoked. She does not have any smokeless tobacco history on file. She reports that she does not drink alcohol or use illicit drugs.  Allergies: No Known Allergies   (Not in a hospital admission)  Results for orders placed during the hospital encounter of 05/15/13 (from the past 48 hour(s))  CBC WITH DIFFERENTIAL     Status: Abnormal   Collection Time    05/15/13  7:55 PM      Result Value Range   WBC 11.5 (*) 4.0 - 10.5 K/uL   RBC 4.15  3.87 - 5.11 MIL/uL   Hemoglobin 11.9 (*) 12.0 - 15.0 g/dL   HCT 82.9 (*) 56.2 - 13.0 %   MCV 84.8  78.0 - 100.0 fL   MCH 28.7  26.0 - 34.0 pg   MCHC 33.8  30.0 - 36.0 g/dL   RDW 86.5  78.4 - 69.6 %   Platelets 207  150 - 400 K/uL   Neutrophils Relative % 27 (*) 43 - 77 %   Neutro Abs 3.1  1.7 - 7.7 K/uL   Lymphocytes Relative 60 (*) 12 - 46 %   Lymphs Abs 6.8 (*) 0.7 - 4.0 K/uL   Monocytes Relative 10  3 - 12 %   Monocytes Absolute 1.1 (*) 0.1 - 1.0 K/uL   Eosinophils Relative 2  0 - 5 %   Eosinophils Absolute 0.2  0.0 - 0.7 K/uL   Basophils Relative 3 (*) 0 - 1 %   Basophils Absolute 0.3 (*) 0.0 - 0.1 K/uL   WBC Morphology ABSOLUTE LYMPHOCYTOSIS     Comment: ATYPICAL  LYMPHOCYTES     ATYPICAL MONONUCLEAR CELLS   RBC Morphology POLYCHROMASIA PRESENT     Smear Review LARGE PLATELETS PRESENT     Comment: GIANT PLATELETS SEEN  LIPASE, BLOOD     Status: None   Collection Time    05/15/13  7:55 PM      Result Value Range   Lipase 41  11 - 59 U/L  COMPREHENSIVE METABOLIC PANEL     Status: Abnormal   Collection Time    05/15/13  8:11 PM      Result Value Range   Sodium 136  135 - 145 mEq/L   Potassium 4.0  3.5 - 5.1 mEq/L   Chloride 105  96 - 112 mEq/L   CO2 17 (*) 19 - 32 mEq/L   Glucose, Bld 79  70 - 99 mg/dL   BUN 9  6 - 23 mg/dL   Creatinine, Ser 2.95  0.50 - 1.10 mg/dL   Calcium 8.9  8.4 - 28.4 mg/dL   Total Protein 7.2  6.0 - 8.3 g/dL   Albumin 3.0 (*) 3.5 - 5.2 g/dL   AST 161 (*) 0 - 37 U/L   ALT 361 (*) 0 - 35 U/L   Alkaline Phosphatase 231 (*) 39 - 117 U/L   Total Bilirubin 0.6  0.3 - 1.2 mg/dL   GFR calc non Af Amer >90  >90 mL/min   GFR calc Af Amer >90  >90 mL/min   Comment:            The eGFR has been calculated     using the CKD EPI equation.     This calculation has not been     validated in all clinical     situations.     eGFR's persistently     <90 mL/min signify     possible Chronic Kidney Disease.  URINALYSIS, ROUTINE W REFLEX MICROSCOPIC     Status: Abnormal   Collection Time    05/15/13  8:26 PM      Result Value Range   Color, Urine AMBER (*) YELLOW   Comment: BIOCHEMICALS MAY BE AFFECTED BY COLOR   APPearance CLOUDY (*) CLEAR   Specific Gravity, Urine 1.031 (*) 1.005 - 1.030   pH 5.0  5.0 - 8.0   Glucose, UA NEGATIVE  NEGATIVE mg/dL   Hgb urine dipstick NEGATIVE  NEGATIVE   Bilirubin Urine SMALL (*) NEGATIVE   Ketones, ur 15 (*) NEGATIVE mg/dL   Protein, ur NEGATIVE  NEGATIVE mg/dL   Urobilinogen, UA 1.0  0.0 - 1.0 mg/dL   Nitrite NEGATIVE  NEGATIVE   Leukocytes, UA MODERATE (*) NEGATIVE  PREGNANCY, URINE     Status: None   Collection Time    05/15/13  8:26 PM      Result Value Range   Preg Test, Ur  NEGATIVE  NEGATIVE   Comment:            THE SENSITIVITY OF THIS     METHODOLOGY IS >20 mIU/mL.  URINE MICROSCOPIC-ADD ON     Status: Abnormal   Collection Time    05/15/13  8:26 PM      Result Value Range   Squamous Epithelial / LPF FEW (*) RARE   WBC, UA 3-6  <3 WBC/hpf   Bacteria, UA MANY (*) RARE   US Abdomen Complete  05/15/2013   *RADIOLOGY REPORT*  Clinical Data:  Abdominal pain  COMPLETE ABDOMINAL ULTRASOUND  Comparison:  Prior abdominal MRI 10/28/2012; prior abdominal ultrasound 10/18/2012  Findings:  Gallbladder:  Multiple mobile echogenic shadowing foci consistent with cholelithiasis.  Per the sonographer, the sonographic Murphy's sign was positive.  No pericholecystic fluid.  No gallbladder wall thickening.  Common bile duct:  Within normal limits at less than 3 mm in caliber.  Liver:  No focal lesion identified.  Within normal limits in parenchymal echogenicity.  IVC:  Appears normal.  Pancreas:  No focal abnormality seen.  Spleen:  Sonographically unremarkable.  9.7 cm in length.  Right Kidney:  Right kidney measures 9.7 cm and length.  There is a small cluster of stones in the interpolar region consistent with the prior MRI findings.  No focal solid lesion.  No hydronephrosis.  Left Kidney:  Normal reniform contour without hydronephrosis or nephrolithiasis.  10 cm in length.  Abdominal aorta:  No aneurysm identified.  IMPRESSION:  1.  Cholelithiasis.  Although the sonographic Murphy's sign was positive, there is no gallbladder wall thickening or pericholecystic fluid.  Sonographic findings are suggestive of but not diagnostic of  acute cholecystitis.  Chronic cholecystitis is a differential consideration.  If additional imaging is clinically warranted, nuclear medicine HIDA scan could confirm patency or obstruction of the cystic duct.  2.  Cluster of small stones in the mid right kidney as seen on prior MRI.   Original Report Authenticated By: Malachy Moan, M.D.    Review of Systems   Constitutional: Negative.   HENT: Negative.   Eyes: Negative.   Respiratory: Negative.   Cardiovascular: Negative.   Gastrointestinal: Positive for nausea, vomiting and abdominal pain.  Genitourinary: Negative.   Musculoskeletal: Negative.   Skin: Negative.   Neurological: Negative.   Endo/Heme/Allergies: Negative.   Psychiatric/Behavioral: Negative.     Blood pressure 121/75, pulse 82, temperature 98.4 F (36.9 C), temperature source Oral, resp. rate 18, height 5\' 5"  (1.651 m), weight 182 lb (82.555 kg), last menstrual period 04/25/2013, SpO2 98.00%. Physical Exam  Constitutional: She is oriented to person, place, and time. She appears well-developed and well-nourished.  HENT:  Head: Normocephalic and atraumatic.  Eyes: Conjunctivae and EOM are normal. Pupils are equal, round, and reactive to light.  Neck: Normal range of motion. Neck supple.  Cardiovascular: Normal rate, regular rhythm and normal heart sounds.   Respiratory: Effort normal and breath sounds normal.  GI: Soft. Bowel sounds are normal.  Mild RUQ pain. No guarding. No palpable mass  Musculoskeletal: Normal range of motion.  Neurological: She is alert and oriented to person, place, and time.  Skin: Skin is warm and dry.  Psychiatric: She has a normal mood and affect. Her behavior is normal.     Assessment/Plan The patient appears to have symptomatic gallstones. Because of the risk of further painful episodes of possible pancreatitis I think she would benefit from having her gallbladder removed. I've discussed with her in detail the risks and benefits of the operation to remove the gallbladder as well as some of the technical aspects and she understands and wishes to proceed. We will plan to admit her to the hospital and resuscitate her with IV fluids. We will recheck her liver functions in the morning and discuss surgery with the primary team TOTH III,Annibelle Brazie S 05/15/2013, 11:37 PM

## 2013-05-15 NOTE — ED Notes (Signed)
Pt complains of severe abd pain and vomiting, she states that she was dx with gallstones and kidney stones one year ago

## 2013-05-16 ENCOUNTER — Encounter (HOSPITAL_COMMUNITY): Payer: Self-pay | Admitting: Anesthesiology

## 2013-05-16 ENCOUNTER — Encounter (HOSPITAL_COMMUNITY)
Admission: EM | Disposition: A | Discharge status: Home / Self Care | Payer: Self-pay | Source: Home / Self Care | Attending: Emergency Medicine

## 2013-05-16 ENCOUNTER — Observation Stay (HOSPITAL_COMMUNITY): Payer: No Typology Code available for payment source | Admitting: Anesthesiology

## 2013-05-16 ENCOUNTER — Ambulatory Visit: Payer: No Typology Code available for payment source | Admitting: Family Medicine

## 2013-05-16 DIAGNOSIS — K8010 Calculus of gallbladder with chronic cholecystitis without obstruction: Secondary | ICD-10-CM

## 2013-05-16 HISTORY — PX: CHOLECYSTECTOMY: SHX55

## 2013-05-16 LAB — COMPREHENSIVE METABOLIC PANEL
Alkaline Phosphatase: 197 U/L — ABNORMAL HIGH (ref 39–117)
BUN: 9 mg/dL (ref 6–23)
Calcium: 8.2 mg/dL — ABNORMAL LOW (ref 8.4–10.5)
GFR calc Af Amer: >90 mL/min (ref >90)
Glucose, Bld: 94 mg/dL (ref 70–99)
Total Protein: 6 g/dL (ref 6.0–8.3)

## 2013-05-16 LAB — CBC
HCT: 30.9 % — ABNORMAL LOW (ref 36.0–46.0)
Hemoglobin: 9.9 g/dL — ABNORMAL LOW (ref 12.0–15.0)
RDW: 13.9 % (ref 11.5–15.5)
WBC: 8.9 10*3/uL (ref 4.0–10.5)

## 2013-05-16 LAB — SURGICAL PCR SCREEN: Staphylococcus aureus: NEGATIVE

## 2013-05-16 SURGERY — LAPAROSCOPIC CHOLECYSTECTOMY WITH INTRAOPERATIVE CHOLANGIOGRAM
Anesthesia: General | Site: Abdomen | Wound class: Clean Contaminated

## 2013-05-16 MED ORDER — LORAZEPAM 2 MG/ML IJ SOLN
0.5000 mg | Freq: Three times a day (TID) | INTRAMUSCULAR | Status: DC | PRN
Start: 2013-05-16 — End: 2013-05-17
  Administered 2013-05-16: 0.5 mg via INTRAVENOUS
  Filled 2013-05-16: qty 1

## 2013-05-16 MED ORDER — ONDANSETRON HCL 4 MG PO TABS: 4.0000 mg | ORAL_TABLET | Freq: Four times a day (QID) | ORAL | Status: DC | PRN

## 2013-05-16 MED ORDER — HYDROCODONE-ACETAMINOPHEN 5-325 MG PO TABS
1.0000 | ORAL_TABLET | ORAL | Status: DC | PRN
  Administered 2013-05-16 – 2013-05-17 (×3): 2 via ORAL
  Filled 2013-05-16 (×3): qty 2

## 2013-05-16 MED ORDER — LORAZEPAM BOLUS VIA INFUSION
0.5000 mg | Freq: Three times a day (TID) | INTRAVENOUS | Status: DC | PRN
  Filled 2013-05-16: qty 1

## 2013-05-16 MED ORDER — ONDANSETRON HCL 4 MG/2ML IJ SOLN: 4.0000 mg | Freq: Four times a day (QID) | INTRAMUSCULAR | Status: DC | PRN

## 2013-05-16 MED ORDER — ROCURONIUM BROMIDE 100 MG/10ML IV SOLN
INTRAVENOUS | Status: DC | PRN
  Administered 2013-05-16: 15 mg via INTRAVENOUS
  Administered 2013-05-16: 5 mg via INTRAVENOUS
  Administered 2013-05-16: 10 mg via INTRAVENOUS

## 2013-05-16 MED ORDER — KETOROLAC TROMETHAMINE 30 MG/ML IJ SOLN
30.0000 mg | Freq: Once | INTRAMUSCULAR | Status: AC
  Administered 2013-05-16: 30 mg via INTRAVENOUS

## 2013-05-16 MED ORDER — SODIUM CHLORIDE 0.9 % IV BOLUS (SEPSIS): 1000.0000 mL | Freq: Once | INTRAVENOUS | Status: AC

## 2013-05-16 MED ORDER — ONDANSETRON HCL 4 MG/2ML IJ SOLN
4.0000 mg | Freq: Four times a day (QID) | INTRAMUSCULAR | Status: DC | PRN
  Administered 2013-05-16: 4 mg via INTRAVENOUS

## 2013-05-16 MED ORDER — NEOSTIGMINE METHYLSULFATE 1 MG/ML IJ SOLN
INTRAMUSCULAR | Status: DC | PRN
  Administered 2013-05-16: 4 mg via INTRAVENOUS

## 2013-05-16 MED ORDER — ACETAMINOPHEN 325 MG PO TABS: 650.0000 mg | ORAL_TABLET | ORAL | Status: DC | PRN

## 2013-05-16 MED ORDER — FENTANYL CITRATE 0.05 MG/ML IJ SOLN
25.0000 ug | INTRAMUSCULAR | Status: DC | PRN
  Administered 2013-05-16 (×2): 50 ug via INTRAVENOUS

## 2013-05-16 MED ORDER — LACTATED RINGERS IR SOLN
Status: DC | PRN
  Administered 2013-05-16: 800 mL

## 2013-05-16 MED ORDER — LACTATED RINGERS IV SOLN: INTRAVENOUS | Status: DC

## 2013-05-16 MED ORDER — GABAPENTIN 300 MG PO CAPS
300.0000 mg | ORAL_CAPSULE | Freq: Three times a day (TID) | ORAL | Status: DC
  Administered 2013-05-16 – 2013-05-17 (×2): 300 mg via ORAL
  Filled 2013-05-16 (×6): qty 1

## 2013-05-16 MED ORDER — TOPIRAMATE 100 MG PO TABS
100.0000 mg | ORAL_TABLET | Freq: Two times a day (BID) | ORAL | Status: DC
  Administered 2013-05-16 – 2013-05-17 (×2): 100 mg via ORAL
  Filled 2013-05-16 (×5): qty 1

## 2013-05-16 MED ORDER — GLYCOPYRROLATE 0.2 MG/ML IJ SOLN
INTRAMUSCULAR | Status: DC | PRN
  Administered 2013-05-16: 0.6 mg via INTRAVENOUS

## 2013-05-16 MED ORDER — MIDAZOLAM HCL 5 MG/5ML IJ SOLN
INTRAMUSCULAR | Status: DC | PRN
  Administered 2013-05-16: 2 mg via INTRAVENOUS

## 2013-05-16 MED ORDER — HYDROMORPHONE HCL PF 1 MG/ML IJ SOLN: 1.0000 mg | INTRAMUSCULAR | Status: DC | PRN

## 2013-05-16 MED ORDER — BUPIVACAINE-EPINEPHRINE 0.5% -1:200000 IJ SOLN
INTRAMUSCULAR | Status: DC | PRN
  Administered 2013-05-16: 20 mL

## 2013-05-16 MED ORDER — ONDANSETRON HCL 4 MG/2ML IJ SOLN
INTRAMUSCULAR | Status: DC | PRN
  Administered 2013-05-16: 4 mg via INTRAVENOUS

## 2013-05-16 MED ORDER — HYDROXYZINE HCL 25 MG PO TABS
25.0000 mg | ORAL_TABLET | Freq: Three times a day (TID) | ORAL | Status: DC | PRN
  Filled 2013-05-16: qty 1

## 2013-05-16 MED ORDER — FENTANYL CITRATE 0.05 MG/ML IJ SOLN
INTRAMUSCULAR | Status: DC | PRN
  Administered 2013-05-16 (×2): 100 ug via INTRAVENOUS
  Administered 2013-05-16: 50 ug via INTRAVENOUS

## 2013-05-16 MED ORDER — CEFAZOLIN SODIUM-DEXTROSE 2-3 GM-% IV SOLR
2.0000 g | INTRAVENOUS | Status: AC
  Administered 2013-05-16: 2 g via INTRAVENOUS
  Filled 2013-05-16 (×2): qty 50

## 2013-05-16 MED ORDER — PANTOPRAZOLE SODIUM 40 MG IV SOLR
40.0000 mg | Freq: Every day | INTRAVENOUS | Status: DC
  Administered 2013-05-16 (×2): 40 mg via INTRAVENOUS
  Filled 2013-05-16 (×3): qty 40

## 2013-05-16 MED ORDER — SUCCINYLCHOLINE CHLORIDE 20 MG/ML IJ SOLN
INTRAMUSCULAR | Status: DC | PRN
  Administered 2013-05-16: 100 mg via INTRAVENOUS

## 2013-05-16 MED ORDER — FLUOXETINE HCL 20 MG PO CAPS
20.0000 mg | ORAL_CAPSULE | Freq: Two times a day (BID) | ORAL | Status: DC
  Administered 2013-05-16 – 2013-05-17 (×2): 20 mg via ORAL
  Filled 2013-05-16 (×5): qty 1

## 2013-05-16 MED ORDER — HYDROMORPHONE HCL PF 1 MG/ML IJ SOLN
0.2500 mg | INTRAMUSCULAR | Status: DC | PRN
  Administered 2013-05-16 (×4): 0.5 mg via INTRAVENOUS

## 2013-05-16 MED ORDER — LIDOCAINE HCL (CARDIAC) 20 MG/ML IV SOLN
INTRAVENOUS | Status: DC | PRN
  Administered 2013-05-16: 50 mg via INTRAVENOUS

## 2013-05-16 MED ORDER — KCL IN DEXTROSE-NACL 20-5-0.45 MEQ/L-%-% IV SOLN
INTRAVENOUS | Status: DC
  Administered 2013-05-16 – 2013-05-17 (×2): via INTRAVENOUS
  Filled 2013-05-16 (×2): qty 1000

## 2013-05-16 MED ORDER — MORPHINE SULFATE 4 MG/ML IJ SOLN
4.0000 mg | INTRAMUSCULAR | Status: DC | PRN
Start: 2013-05-16 — End: 2013-05-17
  Administered 2013-05-16: 4 mg via INTRAVENOUS
  Filled 2013-05-16: qty 1

## 2013-05-16 MED ORDER — PROPOFOL 10 MG/ML IV BOLUS
INTRAVENOUS | Status: DC | PRN
  Administered 2013-05-16: 180 mg via INTRAVENOUS

## 2013-05-16 MED ORDER — LACTATED RINGERS IV SOLN
INTRAVENOUS | Status: DC | PRN
  Administered 2013-05-16 (×2): via INTRAVENOUS

## 2013-05-16 MED ORDER — KCL IN DEXTROSE-NACL 20-5-0.9 MEQ/L-%-% IV SOLN
INTRAVENOUS | Status: DC
  Administered 2013-05-16: 02:00:00 via INTRAVENOUS
  Filled 2013-05-16 (×3): qty 1000

## 2013-05-16 SURGICAL SUPPLY — 38 items
APPLIER CLIP ROT 10 11.4 M/L (STAPLE) ×2
BENZOIN TINCTURE PRP APPL 2/3 (GAUZE/BANDAGES/DRESSINGS) ×2 IMPLANT
CABLE HIGH FREQUENCY MONO STRZ (ELECTRODE) ×2 IMPLANT
CANISTER SUCTION 2500CC (MISCELLANEOUS) ×2 IMPLANT
CHLORAPREP W/TINT 26ML (MISCELLANEOUS) ×2 IMPLANT
CLIP APPLIE ROT 10 11.4 M/L (STAPLE) ×1 IMPLANT
CLOSURE STERI-STRIP 1/4X4 (GAUZE/BANDAGES/DRESSINGS) ×2 IMPLANT
CLOTH BEACON ORANGE TIMEOUT ST (SAFETY) ×2 IMPLANT
COVER MAYO STAND STRL (DRAPES) ×2 IMPLANT
DECANTER SPIKE VIAL GLASS SM (MISCELLANEOUS) ×2 IMPLANT
DRAPE C-ARM 42X72 X-RAY (DRAPES) ×2 IMPLANT
DRAPE LAPAROSCOPIC ABDOMINAL (DRAPES) ×2 IMPLANT
DRAPE UTILITY XL STRL (DRAPES) ×2 IMPLANT
ELECT REM PT RETURN 9FT ADLT (ELECTROSURGICAL) ×2
ELECTRODE REM PT RTRN 9FT ADLT (ELECTROSURGICAL) ×1 IMPLANT
GAUZE SPONGE 2X2 8PLY STRL LF (GAUZE/BANDAGES/DRESSINGS) ×1 IMPLANT
GLOVE BIOGEL PI IND STRL 7.0 (GLOVE) ×1 IMPLANT
GLOVE BIOGEL PI INDICATOR 7.0 (GLOVE) ×1
GLOVE SURG SS PI 8.0 STRL IVOR (GLOVE) ×2 IMPLANT
GOWN STRL REIN XL XLG (GOWN DISPOSABLE) ×6 IMPLANT
HEMOSTAT SURGICEL 4X8 (HEMOSTASIS) IMPLANT
KIT BASIN OR (CUSTOM PROCEDURE TRAY) ×2 IMPLANT
NS IRRIG 1000ML POUR BTL (IV SOLUTION) ×2 IMPLANT
POUCH SPECIMEN RETRIEVAL 10MM (ENDOMECHANICALS) ×2 IMPLANT
SCISSORS LAP 5X35 DISP (ENDOMECHANICALS) ×2 IMPLANT
SET CHOLANGIOGRAPH MIX (MISCELLANEOUS) ×2 IMPLANT
SET IRRIG TUBING LAPAROSCOPIC (IRRIGATION / IRRIGATOR) ×2 IMPLANT
SLEEVE XCEL OPT CAN 5 100 (ENDOMECHANICALS) ×2 IMPLANT
SOLUTION ANTI FOG 6CC (MISCELLANEOUS) ×2 IMPLANT
SPONGE GAUZE 2X2 STER 10/PKG (GAUZE/BANDAGES/DRESSINGS) ×1
STRIP CLOSURE SKIN 1/2X4 (GAUZE/BANDAGES/DRESSINGS) ×2 IMPLANT
SUT MNCRL AB 4-0 PS2 18 (SUTURE) ×2 IMPLANT
TAPE CLOTH SURG 4X10 WHT LF (GAUZE/BANDAGES/DRESSINGS) ×2 IMPLANT
TOWEL OR 17X26 10 PK STRL BLUE (TOWEL DISPOSABLE) ×2 IMPLANT
TRAY LAP CHOLE (CUSTOM PROCEDURE TRAY) ×2 IMPLANT
TROCAR XCEL BLUNT TIP 100MML (ENDOMECHANICALS) ×2 IMPLANT
TROCAR XCEL NON-BLD 11X100MML (ENDOMECHANICALS) ×2 IMPLANT
TUBING INSUFFLATION 10FT LAP (TUBING) ×2 IMPLANT

## 2013-05-16 NOTE — Anesthesia Preprocedure Evaluation (Addendum)
Anesthesia Evaluation  Patient identified by MRN, date of birth, ID band Patient awake    Reviewed: Allergy & Precautions, H&P , NPO status , Patient's Chart, lab work & pertinent test results  Airway Mallampati: II TM Distance: >3 FB Neck ROM: full    Dental no notable dental hx. (+) Teeth Intact and Dental Advisory Given   Pulmonary neg pulmonary ROS,  breath sounds clear to auscultation  Pulmonary exam normal       Cardiovascular Exercise Tolerance: Good negative cardio ROS  Rhythm:regular Rate:Normal     Neuro/Psych Anxiety Depression Bipolar Disorder negative neurological ROS     GI/Hepatic negative GI ROS, Neg liver ROS,   Endo/Other  negative endocrine ROS  Renal/GU negative Renal ROS  negative genitourinary   Musculoskeletal   Abdominal   Peds  Hematology negative hematology ROS (+) anemia , Hgb. 9.9   Anesthesia Other Findings   Reproductive/Obstetrics negative OB ROS                         Anesthesia Physical Anesthesia Plan  ASA: II  Anesthesia Plan: General   Post-op Pain Management:    Induction: Intravenous  Airway Management Planned: Oral ETT  Additional Equipment:   Intra-op Plan:   Post-operative Plan: Extubation in OR  Informed Consent: I have reviewed the patients History and Physical, chart, labs and discussed the procedure including the risks, benefits and alternatives for the proposed anesthesia with the patient or authorized representative who has indicated his/her understanding and acceptance.   Dental Advisory Given  Plan Discussed with: CRNA and Surgeon  Anesthesia Plan Comments:         Anesthesia Quick Evaluation

## 2013-05-16 NOTE — Progress Notes (Signed)
General Surgery [facility]  Patient seen and examined.  Plan lap chole with IOC later today.  The risks and benefits of the procedure have been discussed at length with the patient.  The patient understands the proposed procedure, potential alternative treatments, and the course of recovery to be expected.  All of the patient's questions have been answered at this time.  The patient wishes to proceed with surgery.  Velora Heckler, MD, [facility] Office: [phone]

## 2013-05-16 NOTE — Anesthesia Postprocedure Evaluation (Signed)
  Anesthesia Post-op Note  Patient: Jeanette Nicholson  Procedure(s) Performed: Procedure(s) (LRB): LAPAROSCOPIC CHOLECYSTECTOMY  (N/A)  Patient Location: PACU  Anesthesia Type: General  Level of Consciousness: awake and alert   Airway and Oxygen Therapy: Patient Spontanous Breathing  Post-op Pain: mild  Post-op Assessment: Post-op Vital signs reviewed, Patient's Cardiovascular Status Stable, Respiratory Function Stable, Patent Airway and No signs of Nausea or vomiting  Last Vitals:  Filed Vitals:   05/16/13 1600  BP: 106/50  Pulse: 91  Temp: 36.5 C  Resp: 15    Post-op Vital Signs: stable   Complications: No apparent anesthesia complications

## 2013-05-16 NOTE — Transfer of Care (Signed)
Immediate Anesthesia Transfer of Care Note  Patient: Jeanette Nicholson  Procedure(s) Performed: Procedure(s): LAPAROSCOPIC CHOLECYSTECTOMY  (N/A)  Patient Location: PACU  Anesthesia Type:General  Level of Consciousness: awake, alert , oriented and patient cooperative  Airway & Oxygen Therapy: Patient Spontanous Breathing and Patient connected to face mask oxygen  Post-op Assessment: Report given to PACU RN and Post -op Vital signs reviewed and stable  Post vital signs: Reviewed and stable  Complications: No apparent anesthesia complications

## 2013-05-16 NOTE — Op Note (Signed)
Procedure Note  Pre-operative Diagnosis: Calculus of gallbladder with other cholecystitis, without mention of obstruction  Post-operative Diagnosis: Same  Surgeon:  Velora Heckler, MD, FACS  Procedure:  Laparoscopic cholecystectomy  Assistant:  none   Anesthesia:  General  Indications: This patient presents with symptomatic gallbladder disease and will undergo laparoscopic cholecystectomy with intraoperative cholangiography.  Procedure Details: The patient was seen in the pre-op holding area. The risks, benefits, complications, treatment options, and expected outcomes have been discussed with the patient. The patient and/or family agreed with the proposed plan and signed the informed consent form.  The patient was taken to Operating Room, identified as Jeanette Nicholson and the procedure verified as Laparoscopic Cholecystectomy with Intraoperative Cholangiogram. A "time out" was completed and the above information confirmed.  Prior to the induction of general anesthesia, antibiotic prophylaxis was administered. General endotracheal anesthesia was then administered and tolerated well. After the induction, the abdomen was prepped in the usual strict aseptic fashion. The patient was in the supine position.  An incision was made in the skin near the umbilicus. The midline fascia was incised and the peritoneal cavity entered and the Hasson canula was introduced under direct vision. Hasson canula was secured with a pursestring 0-Vicryl suture. Pneumoperitoneum was then established with carbon dioxide and tolerated well without any adverse changes in the patient's vital signs. Additional trocars were introduced under direct vision along the right costal margin in the midline, mid-clavicular line, and anterior axillary line.  The gallbladder was identified and the fundus grasped and retracted cephalad. Adhesions were taken down bluntly and with the electrocautery as needed, taking care not to  injure any adjacent structures. The infundibulum was grasped and retracted laterally, exposing the peritoneum overlying the triangle of Calot. This was incised and structures exposed in a blunt fashion. The cystic duct was clearly identified and bluntly dissected circumferentially.  It was quite diminutive and decision was made not to attempt cholangiography.  Duct was doubly clipped and divided sharply.  An incision was made in the cystic duct and the cholangiogram catheter introduced. The catheter was secured using an ligaclip.  Real-time cholangiography was performed using the C-arm.  There was rapid filling of a normal caliber common bile duct.  There was reflux of contrast into the left and right hepatic ductal systems.  There was free flow distally into the duodenum without filling defect or obstruction.  Catheter was removed from the peritoneal cavity.  The gallbladder was dissected from the liver bed with the electrocautery used for hemostasis. The gallbladder was completely removed and placed into an endocatch bag. The right upper quadrant was irrigated and inspected. Hemostasis was achieved with the electrocautery. Warm saline irrigation was utilized and was repeatedly aspirated until clear.  Pneumoperitoneum was released after viewing removal of the trocars with good hemostasis noted. The umbilical wound was irrigated and the fascia was then closed with the pursestring suture.  The skin was then closed with 4-0 Monocril subcuticular sutures and sterile dressings were applied.  Instrument, sponge, and needle counts were correct at the conclusion of the case.  The patient tolerated the procedure well.  Estimated Blood Loss: Minimal         Drains: none         Specimens: Gallbladder to pathology         Disposition: PACU - hemodynamically stable.         Condition: stable   Velora Heckler, MD, [facility] Office: [phone]

## 2013-05-16 NOTE — Progress Notes (Signed)
Subjective: Jeanette Nicholson complaining of constant right upper quadrant pain.  Now starting to ease up.  Associated with loose stools.   Father at bedside.  Reports feeling anxiety with pending surgery.  ROS  Const: denies fever, chills or sweats.  Resp: denies shortness of breath, cough or wheezing.  Cardio: Denies chest pains, dizziness or palpitations.  GU: denies dysuria.  Abd: positive abdominal pain and bloating.  Objective: Vital signs in last 24 hours: Temp:  [98.1 F (36.7 C)-98.4 F (36.9 C)] 98.1 F (36.7 C) (05/29 0707) Pulse Rate:  [71-82] 71 (05/29 0707) Resp:  [18] 18 (05/29 0707) BP: (96-121)/(64-75) 97/64 mmHg (05/29 0707) SpO2:  [94 %-98 %] 97 % (05/29 0707) Weight:  [182 lb (82.555 kg)] 182 lb (82.555 kg) (05/28 1929) Last BM Date:  (unknown)  Intake/Output from previous day: 05/28 0701 - 05/29 0700 In: 543.8 [I.V.:543.8] Out: -  Intake/Output this shift:    General appearance: alert, cooperative, appears stated age and no distress Neck: no adenopathy, supple, symmetrical, trachea midline and thyroid not enlarged, symmetric, no tenderness/mass/nodules Resp: clear to auscultation bilaterally and no wheezes or crackles.  No chest wall tenderness Cardio: regular rate and rhythm, S1, S2 normal, no murmur, click, rub or gallop GI: soft, flat and tender to RUQ.  +BS, no masses or hernias.  No signs of peritonitis. Extremities: extremities normal, atraumatic, no cyanosis or edema and no edema, redness or tenderness in the calves or thighs Pulses: 2+ and symmetric Skin: Skin color, texture, turgor normal. No rashes or lesions Neurologic: Alert and oriented X 3, normal strength and tone. Normal symmetric reflexes. Normal coordination and gait  Lab Results:   Recent Labs  05/15/13 1955 05/16/13 0446  WBC 11.5* 8.9  HGB 11.9* 9.9*  HCT 35.2* 30.9*  PLT 207 174   BMET  Recent Labs  05/15/13 2011 05/16/13 0446  NA 136 137  K 4.0 3.7  CL 105 109  CO2 17* 21   GLUCOSE 79 94  BUN 9 9  CREATININE 0.76 0.78  CALCIUM 8.9 8.2*    Studies/Results: US Abdomen Complete  05/15/2013   *RADIOLOGY REPORT*  Clinical Data:  Abdominal pain  COMPLETE ABDOMINAL ULTRASOUND  Comparison:  Prior abdominal MRI 10/28/2012; prior abdominal ultrasound 10/18/2012  Findings:  Gallbladder:  Multiple mobile echogenic shadowing foci consistent with cholelithiasis.  Per the sonographer, the sonographic Murphy's sign was positive.  No pericholecystic fluid.  No gallbladder wall thickening.  Common bile duct:  Within normal limits at less than 3 mm in caliber.  Liver:  No focal lesion identified.  Within normal limits in parenchymal echogenicity.  IVC:  Appears normal.  Pancreas:  No focal abnormality seen.  Spleen:  Sonographically unremarkable.  9.7 cm in length.  Right Kidney:  Right kidney measures 9.7 cm and length.  There is a small cluster of stones in the interpolar region consistent with the prior MRI findings.  No focal solid lesion.  No hydronephrosis.  Left Kidney:  Normal reniform contour without hydronephrosis or nephrolithiasis.  10 cm in length.  Abdominal aorta:  No aneurysm identified.  IMPRESSION:  1.  Cholelithiasis.  Although the sonographic Murphy's sign was positive, there is no gallbladder wall thickening or pericholecystic fluid.  Sonographic findings are suggestive of but not diagnostic of acute cholecystitis.  Chronic cholecystitis is a differential consideration.  If additional imaging is clinically warranted, nuclear medicine HIDA scan could confirm patency or obstruction of the cystic duct.  2.  Cluster of small stones in the  mid right kidney as seen on prior MRI.   Original Report Authenticated By: Malachy Moan, M.D.    Anti-infectives: Anti-infectives   None     Assessment/Plan: Cholelithiasis -laparoscopic cholecystectomy with IOC today due to increased risk for developing pancreatitis.   -NPO -will order pre-op antibiotics -obtain  consent -IVF -Pain control -PRN lorazepam for anxiety.   Transaminitis  -repeat LFTs in AM  VTE prophylaxis -SCDs, ambulate -start anticoagulation following surgery  Bonner Puna [facility] ANP-BC Pager [phone]  05/16/2013 7:59 AM

## 2013-05-17 ENCOUNTER — Encounter (HOSPITAL_COMMUNITY): Payer: Self-pay | Admitting: Surgery

## 2013-05-17 LAB — COMPREHENSIVE METABOLIC PANEL
AST: 342 U/L — ABNORMAL HIGH (ref 0–37)
Albumin: 2.8 g/dL — ABNORMAL LOW (ref 3.5–5.2)
BUN: 5 mg/dL — ABNORMAL LOW (ref 6–23)
Calcium: 8.7 mg/dL (ref 8.4–10.5)
Creatinine, Ser: 0.76 mg/dL (ref 0.50–1.10)
GFR calc non Af Amer: >90 mL/min (ref >90)

## 2013-05-17 LAB — URINE CULTURE: Colony Count: 65000

## 2013-05-17 MED ORDER — ACETAMINOPHEN 325 MG PO TABS: ORAL_TABLET | ORAL | Status: DC

## 2013-05-17 MED ORDER — HYDROCODONE-ACETAMINOPHEN 5-325 MG PO TABS: 1.0000 | ORAL_TABLET | ORAL | Status: DC | PRN

## 2013-05-17 MED ORDER — IBUPROFEN 800 MG PO TABS: 400.0000 mg | ORAL_TABLET | Freq: Four times a day (QID) | ORAL | Status: DC | PRN

## 2013-05-17 MED ORDER — IBUPROFEN 400 MG PO TABS: ORAL_TABLET | ORAL | Status: DC

## 2013-05-17 NOTE — ED Provider Notes (Signed)
Medical screening examination/treatment/procedure(s) were performed by non-physician practitioner and as supervising physician I was immediately available for consultation/collaboration.  Socorro Kanitz R. Kasidi Shanker, MD 05/17/13 2304 

## 2013-05-17 NOTE — Discharge Summary (Signed)
General Surgery - [facility]  Reviewed and agree.  Dhruti Ghuman M. Oluwadamilola Deliz, MD, FACS [facility] Office: [phone]   

## 2013-05-17 NOTE — Progress Notes (Signed)
General Surgery [facility]  Reviewed and agree.  Discharge today.  Will see in follow up at [facility] office 2-3 weeks.  Velora Heckler, MD, [facility] Office: [phone]

## 2013-05-17 NOTE — Progress Notes (Signed)
1 Day Post-Op  Subjective: Feels better, ate some breakfast, and has voided.  Worried she can't void properly. Wants to go home.  Objective: Vital signs in last 24 hours: Temp:  [97.5 F (36.4 C)-98.6 F (37 C)] 98.2 F (36.8 C) (05/30 0535) Pulse Rate:  [71-122] 86 (05/30 0535) Resp:  [10-21] 18 (05/30 0535) BP: (88-122)/(37-64) 88/54 mmHg (05/30 0535) SpO2:  [92 %-100 %] 98 % (05/30 0535) Last BM Date:  (pt unsure but thinks 1 to 2 weeks) Regular diet. Afebrile, VSS AST, ALT are up some  Intake/Output from previous day: 05/29 0701 - 05/30 0700 In: 2323.8 [I.V.:2323.8] Out: 2000 [Urine:2000] Intake/Output this shift: Total I/O In: 823.8 [I.V.:823.8] Out: 1600 [Urine:1600]  General appearance: alert, cooperative and no distress Resp: clear to auscultation bilaterally GI: soft, dressing changed and steristrips applied to the main incision It had some serous-bloody drainage, which is better now. few bowel sounds, normal post op tendernes..   Lab Results:   Recent Labs  05/15/13 1955 05/16/13 0446  WBC 11.5* 8.9  HGB 11.9* 9.9*  HCT 35.2* 30.9*  PLT 207 174    BMET  Recent Labs  05/16/13 0446 05/17/13 0423  NA 137 135  K 3.7 4.1  CL 109 105  CO2 21 22  GLUCOSE 94 112*  BUN 9 5*  CREATININE 0.78 0.76  CALCIUM 8.2* 8.7   PT/INR No results found for this basename: LABPROT, INR,  in the last 72 hours   Recent Labs Lab 05/15/13 2011 05/16/13 0446 05/17/13 0423  AST 328* 285* 342*  ALT 361* 310* 381*  ALKPHOS 231* 197* 207*  BILITOT 0.6 0.5 0.5  PROT 7.2 6.0 6.8  ALBUMIN 3.0* 2.6* 2.8*     Lipase     Component Value Date/Time   LIPASE 41 05/15/2013 1955     Studies/Results: US Abdomen Complete  05/15/2013   *RADIOLOGY REPORT*  Clinical Data:  Abdominal pain  COMPLETE ABDOMINAL ULTRASOUND  Comparison:  Prior abdominal MRI 10/28/2012; prior abdominal ultrasound 10/18/2012  Findings:  Gallbladder:  Multiple mobile echogenic shadowing foci  consistent with cholelithiasis.  Per the sonographer, the sonographic Murphy's sign was positive.  No pericholecystic fluid.  No gallbladder wall thickening.  Common bile duct:  Within normal limits at less than 3 mm in caliber.  Liver:  No focal lesion identified.  Within normal limits in parenchymal echogenicity.  IVC:  Appears normal.  Pancreas:  No focal abnormality seen.  Spleen:  Sonographically unremarkable.  9.7 cm in length.  Right Kidney:  Right kidney measures 9.7 cm and length.  There is a small cluster of stones in the interpolar region consistent with the prior MRI findings.  No focal solid lesion.  No hydronephrosis.  Left Kidney:  Normal reniform contour without hydronephrosis or nephrolithiasis.  10 cm in length.  Abdominal aorta:  No aneurysm identified.  IMPRESSION:  1.  Cholelithiasis.  Although the sonographic Murphy's sign was positive, there is no gallbladder wall thickening or pericholecystic fluid.  Sonographic findings are suggestive of but not diagnostic of acute cholecystitis.  Chronic cholecystitis is a differential consideration.  If additional imaging is clinically warranted, nuclear medicine HIDA scan could confirm patency or obstruction of the cystic duct.  2.  Cluster of small stones in the mid right kidney as seen on prior MRI.   Original Report Authenticated By: Malachy Moan, M.D.    Medications: . FLUoxetine  20 mg Oral BID  . gabapentin  300 mg Oral TID  . pantoprazole (  PROTONIX) IV  40 mg Intravenous QHS  . topiramate  100 mg Oral BID   Prior to Admission medications   Medication Sig Start Date End Date Taking? Authorizing Provider  FLUoxetine (PROZAC) 20 MG capsule Take 20 mg by mouth 2 (two) times daily.   Yes Historical Provider, MD  gabapentin (NEURONTIN) 300 MG capsule Take 1 capsule (300 mg total) by mouth 3 (three) times daily. 11/21/12  Yes Shelly Rubenstein, MD  hydrOXYzine (ATARAX/VISTARIL) 25 MG tablet Take 25 mg by mouth 3 (three) times daily as  needed for anxiety.   Yes Historical Provider, MD  Norgestimate-Ethinyl Estradiol Triphasic (ORTHO TRI-CYCLEN, 28,) 0.18/0.215/0.25 MG-35 MCG tablet Take 1 tablet by mouth daily. 06/22/12 06/22/13 Yes Shelly Rubenstein, MD  topiramate (TOPAMAX) 100 MG tablet Take 100 mg by mouth 2 (two) times daily.   Yes Historical Provider, MD   . dextrose 5 % and 0.45 % NaCl with KCl 20 mEq/L 75 mL/hr at 05/17/13 1610    Assessment/Plan Calculus of gallbladder with other cholecystitis, without mention of obstruction  Anxiety Depression Bulemia   Plan:  I think she is doing well.  If she voids again without an issue I will let her go home and follow up in the clinic in 2-3 weeks.       LOS: 2 days    Ifeanyi Mickelson 05/17/2013

## 2013-05-17 NOTE — Discharge Summary (Signed)
Physician Discharge Summary  Patient ID: Jeanette Nicholson MRN: [id number] DOB/AGE: 1991-07-08 22 y.o.  Admit date: 05/15/2013 Discharge date: 05/17/2013  Admission Diagnoses: Symptomatic cholecystitis Anxiety  Depression  Bulemia   Discharge Diagnoses: Calculus of gallbladder with other cholecystitis, without mention of obstruction Anxiety  Depression  Bulemia   Principal Problem:   Cholelithiasis Active Problems:   Transaminitis   PROCEDURES: Laparoscopic cholecystectomy with IOC, 05/16/2013, Velora Heckler, MD     Hospital Course: The pt is a 22 yo wf who has known gallstones. She has been feeling bad for the last year. Her pain is getting worse over the last 2 weeks. She has had significant nausea and vomiting. She has been complaining of right upper quadrant pain. She feels very dehydrated. She came to the emergency room for more another ultrasound continued to show stones in the gallbladder but no gallbladder wall thickening or ductal dilatation. She was admitted and seen by Dr. Gerrit Friends who took her to the OR that morning.   She did well post op and was eating a regular diet the following AM.  Pain was better.  She was ready for discharge the following morning. Follow up in 2 weeks.  Condition on D/C:  Improved  Disposition: 01-Home or Self Care   Future Appointments Provider Department Dept Phone   06/04/2013 12:45 PM [facility] [place] [phone]       Medication List    TAKE these medications       acetaminophen 325 MG tablet  Commonly known as:  TYLENOL  Do not take more than 4000 mg of tylenol acetaminophen per day, it is also in your prescribed pain medicine.     FLUoxetine 20 MG capsule  Commonly known as:  PROZAC  Take 20 mg by mouth 2 (two) times daily.     gabapentin 300 MG capsule  Commonly known as:  NEURONTIN  Take 1 capsule (300 mg total) by mouth 3 (three) times daily.     HYDROcodone-acetaminophen 5-325 MG  per tablet  Commonly known as:  NORCO/VICODIN  Take 1-2 tablets by mouth every 4 (four) hours as needed.     hydrOXYzine 25 MG tablet  Commonly known as:  ATARAX/VISTARIL  Take 25 mg by mouth 3 (three) times daily as needed for anxiety.     ibuprofen 400 MG tablet  Commonly known as:  ADVIL,MOTRIN  You may use this or plain tylenol first. Then prescribed hydrocodone-acetaminophen.  No more than 4000 mg of tylenol per day.     Norgestimate-Ethinyl Estradiol Triphasic 0.18/0.215/0.25 MG-35 MCG tablet  Commonly known as:  ORTHO TRI-CYCLEN (28)  Take 1 tablet by mouth daily.     topiramate 100 MG tablet  Commonly known as:  TOPAMAX  Take 100 mg by mouth 2 (two) times daily.           Follow-up Information   Follow up with [facility] [place] On 06/04/2013. (YOUR APPOINTMENT IS AT 12:45.  BE THERE 30 MINUTES EARLY FOR CHECK-IN.)    Contact information:   [address]   [place] [address] [phone]       Signed: Sherrie George 05/17/2013, 1:59 PM

## 2013-06-04 ENCOUNTER — Ambulatory Visit (INDEPENDENT_AMBULATORY_CARE_PROVIDER_SITE_OTHER): Payer: PRIVATE HEALTH INSURANCE | Admitting: Internal Medicine

## 2013-06-04 ENCOUNTER — Encounter (INDEPENDENT_AMBULATORY_CARE_PROVIDER_SITE_OTHER): Payer: Self-pay

## 2013-06-04 VITALS — BP 110/82 | HR 87 | Temp 97.9°F | Resp 18 | Ht 65.0 in | Wt 182.0 lb

## 2013-06-04 DIAGNOSIS — K8000 Calculus of gallbladder with acute cholecystitis without obstruction: Secondary | ICD-10-CM

## 2013-06-04 NOTE — Progress Notes (Signed)
  Subjective: Pt returns to the clinic today after undergoing laparoscopic cholecystectomy on 05/16/13 by Dr. Gerrit Friends.  The patient is tolerating their diet well but she is having a lot of pain at the umbilicus and RUQ incision, it can be very severe at times, denies n/v, eating ok but appetite not as good as usual.  Bowel function is good.  No problems with the wounds.  Objective: Vital signs in last 24 hours: Reviewed  PE: Abd: soft, mildly tender at umbilicus scar, +bs, incisions well healed  Lab Results:  No results found for this basename: WBC, HGB, HCT, PLT,  in the last 72 hours BMET No results found for this basename: NA, K, CL, CO2, GLUCOSE, BUN, CREATININE, CALCIUM,  in the last 72 hours PT/INR No results found for this basename: LABPROT, INR,  in the last 72 hours CMP     Component Value Date/Time   NA 135 05/17/2013 0423   K 4.1 05/17/2013 0423   CL 105 05/17/2013 0423   CO2 22 05/17/2013 0423   GLUCOSE 112* 05/17/2013 0423   BUN 5* 05/17/2013 0423   CREATININE 0.76 05/17/2013 0423   CREATININE 0.85 11/01/2012 1543   CALCIUM 8.7 05/17/2013 0423   PROT 6.8 05/17/2013 0423   ALBUMIN 2.8* 05/17/2013 0423   AST 342* 05/17/2013 0423   ALT 381* 05/17/2013 0423   ALKPHOS 207* 05/17/2013 0423   BILITOT 0.5 05/17/2013 0423   GFRNONAA >90 05/17/2013 0423   GFRAA >90 05/17/2013 0423   Lipase     Component Value Date/Time   LIPASE 41 05/15/2013 1955       Studies/Results: No results found.  Anti-infectives: Anti-infectives   None       Assessment/Plan  1.  S/P Laparoscopic Cholecystectomy: doing well, I feel like her symptoms are within normal limits for her time out from surgery, reassured her that this should get better, offered 2 options, 1. Waiting and monitoring symptoms, 2. U/s now to eval.  Pt would like to wait and see how symptoms do in next 2-3 weeks.  may resume regular activity without restrictions, Pt will follow up with [place] in 2-3 weeks and knows to call with  questions or concerns.     Caulder Wehner 06/04/2013

## 2013-06-04 NOTE — Patient Instructions (Signed)
May resume regular activity without restrictions. Follow up as needed. Call with questions or concerns.  

## 2013-06-12 ENCOUNTER — Telehealth: Payer: Self-pay | Admitting: Family Medicine

## 2013-06-12 NOTE — Telephone Encounter (Signed)
Pt mother called. Her daughter is out of state and is out of her birth control.  She has contacted the pharmacy and the prescription has no refills. Her daughter doesn't come back until the end of July. Please advise

## 2013-06-25 ENCOUNTER — Encounter (INDEPENDENT_AMBULATORY_CARE_PROVIDER_SITE_OTHER): Payer: PRIVATE HEALTH INSURANCE

## 2013-07-12 ENCOUNTER — Ambulatory Visit: Payer: No Typology Code available for payment source | Admitting: Family Medicine

## 2013-08-06 ENCOUNTER — Encounter: Payer: Self-pay | Admitting: Family Medicine

## 2013-08-06 ENCOUNTER — Ambulatory Visit (INDEPENDENT_AMBULATORY_CARE_PROVIDER_SITE_OTHER): Payer: No Typology Code available for payment source | Admitting: Family Medicine

## 2013-08-06 VITALS — BP 107/70 | HR 101 | Temp 98.4°F | Ht 65.0 in | Wt 182.0 lb

## 2013-08-06 DIAGNOSIS — F988 Other specified behavioral and emotional disorders with onset usually occurring in childhood and adolescence: Secondary | ICD-10-CM | POA: Insufficient documentation

## 2013-08-06 DIAGNOSIS — Z23 Encounter for immunization: Secondary | ICD-10-CM

## 2013-08-06 DIAGNOSIS — M545 Low back pain, unspecified: Secondary | ICD-10-CM

## 2013-08-06 NOTE — Patient Instructions (Signed)
1. I am sorry you have been dealing with this chronic pain. I want to start by sending you to physical therapy. Regardless of whether this is coming from your mind or spine, you have greatly reduced range of motion as a result of you moving less and this can make your pain worse so we need to get you moving again.  2. I want to see you at least every 2-3 weeks over the coming month or two so we can get to know each better and so I can support you.  3. I want you to regularly see your psychiatrist or counselor as chronic pain can be very taxing on [place] and I feel you need support.   Thanks for your time today and sharing your story, Dr. Durene Cal  Health Maintenance Due  Topic Date Due  . Pap Smear -we need to do one of these in the coming months 03/25/2010  . Tetanus/tdap - you can get this today if you want 03/26/2011

## 2013-08-06 NOTE — Assessment & Plan Note (Addendum)
Suspect this is psychogenic in origin. Various complaints from upper and lower extremities not consistent with L3-L4 herniation.  L3-L4 herniation likely an incidental finding. Patient with pinpoint pain more over L5 or sacrum than L3-L4 although this could be radiation of pain. Patient agreeable that there may be psychogenic component. Told patient did not think surgery would benefit her and that it could harm her. In addition, has never tried PT and limited ROM as seems to avoid moving much so will start there. Advised regular follow up and empathized with difficulty of long term chronic pain. Advised to see me every 2-3 weeks for now, counselor every 2 weeks, and psychiatrist monthly as already planned. Stress of chronic pain whether psychogenic or physiologic can have a toll on a patient and want patient to know we are here to support her.

## 2013-08-06 NOTE — Progress Notes (Signed)
[facility] Tana Conch, MD Phone: [phone]  Subjective:   # Chronic Back Pain concern for psychogenic etiology (history bipolar, multiple personality and ADD)  Pain rating, quality, Location-around L5 or at top of sacrum radiating out to sides.  Current associated symptoms-Pain also down R leg >L. Left leg will occasionally feel completely numb. Occasionally feels weak and walks with a cane but has never fallen. Intermittently with numbness in pain ni upper extremities as well Associated symptoms in the past-stuttering speech, shaky, difficulty remembering things Started, duration->10 years, worse in last 2 years with increased emotional stress Worse with most movements. Patient also gives me a summary of her history which statees "several menta/emotional issues that started about the time the back pain got worse approximately 2012". Though unclear which came first.  Relieved by-nothing  Not relieved by gabapentin or vicodin Not tried-PT Previous Treatment-vicodin, gabapentin Previous imaging-12/13 had imaging which showed L3-L4 shallow disc herniation. Previous lumbar x-rays were unremarkable.  Requests: referral to neurosurgery with Dr. Donalee Citrin  ROS-No saddle anesthesia, bladder incontinence, fecal incontinence, History negative for trauma, history of cancer, fever, chills, unintentional weight loss, recent bacterial infection, recent IV drug use, HIV, pain worse at night or while supine.   ROS--See HPI  Past Medical History-multiple personality disorder, bipolar disorder, insomnia, overactive bladder, history gallstones and nephrolithiasis.  Surgical history-s/p cholecystectomy Reviewed problem list.  Medications- reviewed and updated Chief complaint-noted  Objective: BP 107/70  Pulse 101  Temp(Src) 98.4 F (36.9 C) (Oral)  Ht 5\' 5"  (1.651 m)  Wt 182 lb (82.555 kg)  BMI 30.29 kg/m2  LMP 07/07/2013 Gen: NAD, resting comfortably on table,  overweight CV: RRR no murmurs rubs or gallops Lungs: CTAB no crackles, wheeze, rhonchi Skin: warm, dry Neuro: grossly normal, moves all extremities, normal strength throughout upper and lower extremities and intact sensation MSK: spurling negative  Back - Normal skin, Spine with normal alignment and no deformity.  Tenderness to vertebral process palpation at L5 or top of saccrum.  Paraspinous muscles are tender but without spasm.   Range of motion is full at neck but limited with lumbar flexion to hands reaching around levels of knees. Normal lumbar extension. Negative straight leg raise. Almost all movements illicit pain.    Assessment/Plan:  Can do pap smear when follows up.   >50% of a 35 minute office visit were spent on counseling and coordination of care.

## 2013-08-18 ENCOUNTER — Other Ambulatory Visit: Payer: Self-pay | Admitting: Family Medicine

## 2013-09-11 ENCOUNTER — Telehealth: Payer: Self-pay | Admitting: Family Medicine

## 2013-09-11 NOTE — Telephone Encounter (Signed)
Pt would like a refill on her [facility] sent to the pharmacy. JW

## 2013-09-11 NOTE — Telephone Encounter (Signed)
Pt is aware.  Jeanette Nicholson,CMA  

## 2013-09-11 NOTE — Telephone Encounter (Signed)
On 08/18/13 rx was sent with 11 refills to   [facility] - [place], [place] - [address]  Please inform patient.

## 2013-09-18 ENCOUNTER — Encounter (HOSPITAL_COMMUNITY): Payer: Self-pay | Admitting: Emergency Medicine

## 2013-09-18 ENCOUNTER — Emergency Department (HOSPITAL_COMMUNITY)
Admission: EM | Admit: 2013-09-18 | Discharge: 2013-09-19 | Disposition: A | Discharge status: Home / Self Care | Payer: No Typology Code available for payment source | Attending: Emergency Medicine | Admitting: Emergency Medicine

## 2013-09-18 ENCOUNTER — Emergency Department (HOSPITAL_COMMUNITY): Payer: No Typology Code available for payment source

## 2013-09-18 DIAGNOSIS — Z79899 Other long term (current) drug therapy: Secondary | ICD-10-CM | POA: Insufficient documentation

## 2013-09-18 DIAGNOSIS — R109 Unspecified abdominal pain: Secondary | ICD-10-CM | POA: Insufficient documentation

## 2013-09-18 DIAGNOSIS — R197 Diarrhea, unspecified: Secondary | ICD-10-CM | POA: Insufficient documentation

## 2013-09-18 DIAGNOSIS — R112 Nausea with vomiting, unspecified: Secondary | ICD-10-CM

## 2013-09-18 DIAGNOSIS — F502 Bulimia nervosa, unspecified: Secondary | ICD-10-CM | POA: Insufficient documentation

## 2013-09-18 DIAGNOSIS — F3289 Other specified depressive episodes: Secondary | ICD-10-CM | POA: Insufficient documentation

## 2013-09-18 DIAGNOSIS — F329 Major depressive disorder, single episode, unspecified: Secondary | ICD-10-CM | POA: Insufficient documentation

## 2013-09-18 DIAGNOSIS — Z3202 Encounter for pregnancy test, result negative: Secondary | ICD-10-CM | POA: Insufficient documentation

## 2013-09-18 DIAGNOSIS — F411 Generalized anxiety disorder: Secondary | ICD-10-CM | POA: Insufficient documentation

## 2013-09-18 DIAGNOSIS — Z9089 Acquired absence of other organs: Secondary | ICD-10-CM | POA: Insufficient documentation

## 2013-09-18 LAB — CBC WITH DIFFERENTIAL/PLATELET
Basophils Relative: 0 % (ref 0–1)
Eosinophils Absolute: 0.2 10*3/uL (ref 0.0–0.7)
Eosinophils Relative: 2 % (ref 0–5)
Hemoglobin: 13.4 g/dL (ref 12.0–15.0)
Lymphs Abs: 2.8 10*3/uL (ref 0.7–4.0)
MCH: 28.9 pg (ref 26.0–34.0)
MCHC: 32.5 g/dL (ref 30.0–36.0)
MCV: 88.8 fL (ref 78.0–100.0)
Monocytes Relative: 6 % (ref 3–12)
Platelets: 313 10*3/uL (ref 150–400)
RBC: 4.64 MIL/uL (ref 3.87–5.11)

## 2013-09-18 LAB — COMPREHENSIVE METABOLIC PANEL
Albumin: 4.1 g/dL (ref 3.5–5.2)
BUN: 10 mg/dL (ref 6–23)
Calcium: 9.9 mg/dL (ref 8.4–10.5)
GFR calc Af Amer: >90 mL/min (ref >90)
Glucose, Bld: 89 mg/dL (ref 70–99)
Total Protein: 7.6 g/dL (ref 6.0–8.3)

## 2013-09-18 LAB — POCT PREGNANCY, URINE: Preg Test, Ur: NEGATIVE

## 2013-09-18 LAB — URINE MICROSCOPIC-ADD ON

## 2013-09-18 LAB — LIPASE, BLOOD: Lipase: 23 U/L (ref 11–59)

## 2013-09-18 LAB — URINALYSIS, ROUTINE W REFLEX MICROSCOPIC
Nitrite: NEGATIVE
Specific Gravity, Urine: 1.031 — ABNORMAL HIGH (ref 1.005–1.030)
pH: 7 (ref 5.0–8.0)

## 2013-09-18 MED ORDER — GI COCKTAIL ~~LOC~~
30.0000 mL | Freq: Once | ORAL | Status: AC
  Administered 2013-09-18: 30 mL via ORAL
  Filled 2013-09-18: qty 30

## 2013-09-18 MED ORDER — MORPHINE SULFATE 4 MG/ML IJ SOLN
4.0000 mg | Freq: Once | INTRAMUSCULAR | Status: AC
  Administered 2013-09-18: 4 mg via INTRAVENOUS
  Filled 2013-09-18: qty 1

## 2013-09-18 MED ORDER — ONDANSETRON HCL 4 MG/2ML IJ SOLN
4.0000 mg | Freq: Once | INTRAMUSCULAR | Status: AC
  Administered 2013-09-18: 4 mg via INTRAVENOUS
  Filled 2013-09-18: qty 2

## 2013-09-18 MED ORDER — SODIUM CHLORIDE 0.9 % IV BOLUS (SEPSIS)
1000.0000 mL | Freq: Once | INTRAVENOUS | Status: AC
  Administered 2013-09-18: 1000 mL via INTRAVENOUS

## 2013-09-18 NOTE — ED Provider Notes (Signed)
CSN: [id number]     Arrival date & time 09/18/13  1712 History   First MD Initiated Contact with Patient 09/18/13 1858     Chief Complaint  Patient presents with  . Nausea  . Emesis  . Diarrhea   (Consider location/radiation/quality/duration/timing/severity/associated sxs/prior Treatment) HPI  22 year old female with history of bulimia, anxiety and depression and prior surgical history of cholecystectomy presents complaining of abdominal pain with associate nausea vomiting and diarrhea. Patient states she has a history of recurrent abdominal pain. Pain is usually happens 3 times a month. Last night while she was asleep she was awoke by sharp burning pain to her mid abdomen, nonradiating, intense. She felt nauseated and has had a total of 4 bouts of nonbilious vomitus with small blood streak. She has one bouts of loose stools this morning without blood or mucus. Her pain is similar to prior pain except much worse. No associate fever, headache, neck pain, chest pain, shortness of breath, productive cough, hemoptysis, back pain, dysuria, hematuria, vaginal discharge, or rash. Denies any recent trauma. Denies any recent alcohol use. She has her gallbladder removed last month. Her last menstrual period was 3 days ago.  Past Medical History  Diagnosis Date  . Anxiety   . Depression   . Bulimia    Past Surgical History  Procedure Laterality Date  . Cholecystectomy N/A 05/16/2013    Procedure: LAPAROSCOPIC CHOLECYSTECTOMY ;  Surgeon: Velora Heckler, MD;  Location: [facility];  Service: General;  Laterality: N/A;   Family History  Problem Relation Age of Onset  . Depression Mother   . Mental illness Mother    History  Substance Use Topics  . Smoking status: Never Smoker   . Smokeless tobacco: Never Used  . Alcohol Use: Yes     Comment: Occasional.   OB History   Grav Para Term Preterm Abortions TAB SAB Ect Mult Living                 Review of Systems  All other systems reviewed and are  negative.    Allergies  Review of patient's allergies indicates no known allergies.  Home Medications   Current Outpatient Rx  Name  Route  Sig  Dispense  Refill  . FLUoxetine (PROZAC) 20 MG capsule   Oral   Take 20 mg by mouth 2 (two) times daily.         . hydrOXYzine (ATARAX/VISTARIL) 25 MG tablet   Oral   Take 25 mg by mouth 3 (three) times daily as needed for anxiety.         Marland Kitchen QUEtiapine Fumarate (SEROQUEL PO)   Oral   Take by mouth.         . topiramate (TOPAMAX) 100 MG tablet   Oral   Take 100 mg by mouth 2 (two) times daily.         . TRI-SPRINTEC 0.18/0.215/0.25 MG-35 MCG tablet      TAKE ONE TABLET BY MOUTH EVERY DAY   1 Package   11    BP 116/77  Pulse 90  Temp(Src) 98.6 F (37 C) (Oral)  Resp 16  SpO2 100% Physical Exam  Nursing note and vitals reviewed. Constitutional: She appears well-developed and well-nourished. No distress.  Awake, alert, nontoxic appearance  HENT:  Head: Atraumatic.  Eyes: Conjunctivae are normal. Right eye exhibits no discharge. Left eye exhibits no discharge.  Neck: Neck supple.  Cardiovascular: Normal rate and regular rhythm.   Pulmonary/Chest: Effort normal. No respiratory distress.  She exhibits no tenderness.  Abdominal: Soft. There is tenderness (Midabdominal tenderness on palpation without guarding or rebound tenderness. No low abnormal pain. No hernia noted. No overlying skin changes noted.). There is no rebound.  No Murphy sign. No McBurney's point.  Genitourinary:  No CVA tenderness  Musculoskeletal: She exhibits no tenderness.  ROM appears intact, no obvious focal weakness  Skin: No rash noted.  Psychiatric: She has a normal mood and affect.    ED Course  Procedures (including critical care time)  7:36 PM Patient with recurrent abdominal pain. Pain is reproducible on exam. She has a nonsurgical abdomen. She is afebrile with stable normal vital signs. Workup initiated.  9:11 PM Pt's labs are  reassuring, i have low suspicion for appy as her sxs is recurrent and her labs is without significant leukocytosis.    9:58 PM Pt now report pain is worsen, causing some SOB.  Will provide GI cocktail, will also obtain CXR to r/o infectious causes.  Pt agrees with plan.    12:11 AM Workup today has been unremarkable. Patient states the pain is not fully resolve however she request to have a Sprite to drink.  Pt prefers to be discharged to f/u with PCP.  Will d/c with pain medication and antiemetic.  Return precaution discussed.  Pt agrees with plan.    Labs Review Labs Reviewed  COMPREHENSIVE METABOLIC PANEL - Abnormal; Notable for the following:    GFR calc non Af Amer 86 (*)    All other components within normal limits  URINALYSIS, ROUTINE W REFLEX MICROSCOPIC - Abnormal; Notable for the following:    APPearance HAZY (*)    Specific Gravity, Urine 1.031 (*)    Bilirubin Urine SMALL (*)    Protein, ur 30 (*)    Leukocytes, UA SMALL (*)    All other components within normal limits  URINE MICROSCOPIC-ADD ON - Abnormal; Notable for the following:    Bacteria, UA FEW (*)    All other components within normal limits  URINE CULTURE  CBC WITH DIFFERENTIAL  LIPASE, BLOOD  POCT PREGNANCY, URINE   Imaging Review Dg Chest 2 View  09/18/2013   CLINICAL DATA:  Epigastric pain, weakness.  EXAM: CHEST  2 VIEW  COMPARISON:  None.  FINDINGS: The heart size and mediastinal contours are within normal limits. Both lungs are clear. The visualized skeletal structures are unremarkable.  IMPRESSION: No active cardiopulmonary disease.   Electronically Signed   By: Charlett Nose M.D.   On: 09/18/2013 22:38    MDM   1. Abdominal pain   2. Nausea vomiting and diarrhea    BP 121/67  Pulse 90  Temp(Src) 98.1 F (36.7 C) (Oral)  Resp 20  SpO2 100%  LMP 09/16/2013  I have reviewed nursing notes and vital signs. I personally reviewed the imaging tests through PACS system  I reviewed available  ER/hospitalization records thought the EMR     Fayrene Helper, [place] 09/19/13 0017

## 2013-09-18 NOTE — ED Notes (Signed)
MD at bedside. 

## 2013-09-18 NOTE — ED Notes (Signed)
Pt c/o NVD, abd pain x 2 wks.  States that when she vomits, there is blood in it.  When asked if it was streaks of blood, pt states it was a "gob".

## 2013-09-18 NOTE — ED Notes (Signed)
Pt ambulatory to exam room with steady gait.  

## 2013-09-19 MED ORDER — ONDANSETRON HCL 4 MG PO TABS: 4.0000 mg | ORAL_TABLET | Freq: Four times a day (QID) | ORAL | Status: DC

## 2013-09-19 MED ORDER — HYDROCODONE-ACETAMINOPHEN 5-325 MG PO TABS: 2.0000 | ORAL_TABLET | ORAL | Status: DC | PRN

## 2013-09-19 NOTE — ED Provider Notes (Signed)
  Medical screening examination/treatment/procedure(s) were performed by non-physician practitioner and as supervising physician I was immediately available for consultation/collaboration.    Gerhard Munch, MD 09/19/13 Rich Fuchs

## 2013-09-20 LAB — URINE CULTURE: Culture: NO GROWTH

## 2013-10-24 ENCOUNTER — Other Ambulatory Visit: Payer: Self-pay

## 2013-10-28 ENCOUNTER — Ambulatory Visit: Payer: No Typology Code available for payment source

## 2014-01-20 ENCOUNTER — Encounter: Payer: Self-pay | Admitting: Family Medicine

## 2014-01-20 ENCOUNTER — Ambulatory Visit (INDEPENDENT_AMBULATORY_CARE_PROVIDER_SITE_OTHER): Payer: No Typology Code available for payment source | Admitting: Family Medicine

## 2014-01-20 VITALS — BP 129/83 | HR 106 | Temp 98.5°F | Ht 65.0 in | Wt 187.0 lb

## 2014-01-20 DIAGNOSIS — R070 Pain in throat: Secondary | ICD-10-CM

## 2014-01-20 DIAGNOSIS — R109 Unspecified abdominal pain: Secondary | ICD-10-CM | POA: Insufficient documentation

## 2014-01-20 LAB — COMPREHENSIVE METABOLIC PANEL
ALK PHOS: 71 U/L (ref 39–117)
ALT: 14 U/L (ref 0–35)
AST: 16 U/L (ref 0–37)
Albumin: 4 g/dL (ref 3.5–5.2)
BILIRUBIN TOTAL: 0.3 mg/dL (ref 0.2–1.2)
BUN: 9 mg/dL (ref 6–23)
CO2: 22 mEq/L (ref 19–32)
Calcium: 8.6 mg/dL (ref 8.4–10.5)
Chloride: 106 mEq/L (ref 96–112)
Creat: 0.77 mg/dL (ref 0.50–1.10)
GLUCOSE: 103 mg/dL — AB (ref 70–99)
Potassium: 3.7 mEq/L (ref 3.5–5.3)
SODIUM: 134 meq/L — AB (ref 135–145)
TOTAL PROTEIN: 6.6 g/dL (ref 6.0–8.3)

## 2014-01-20 LAB — CBC
HEMATOCRIT: 38.8 % (ref 36.0–46.0)
Hemoglobin: 12.9 g/dL (ref 12.0–15.0)
MCH: 28.5 pg (ref 26.0–34.0)
MCHC: 33.2 g/dL (ref 30.0–36.0)
MCV: 85.7 fL (ref 78.0–100.0)
Platelets: 255 10*3/uL (ref 150–400)
RBC: 4.53 MIL/uL (ref 3.87–5.11)
RDW: 14.1 % (ref 11.5–15.5)
WBC: 6.7 10*3/uL (ref 4.0–10.5)

## 2014-01-20 LAB — LIPASE: Lipase: 76 U/L — ABNORMAL HIGH (ref 0–75)

## 2014-01-20 LAB — POCT RAPID STREP A (OFFICE): Rapid Strep A Screen: NEGATIVE

## 2014-01-20 MED ORDER — ESOMEPRAZOLE MAGNESIUM 40 MG PO CPDR: 40.0000 mg | DELAYED_RELEASE_CAPSULE | Freq: Two times a day (BID) | ORAL | Status: DC

## 2014-01-20 MED ORDER — GI COCKTAIL ~~LOC~~
30.0000 mL | Freq: Once | ORAL | Status: AC
  Administered 2014-01-20: 30 mL via ORAL

## 2014-01-20 NOTE — Patient Instructions (Signed)
Morrie Sheldonshley,  Thank you for coming in today. I am concerned about your history. For work up: blood work. For treatment:  Prilosec 40 mg twice daily. i sent in a prescription. Do not take ibuprofen or any other NSAIDs.  No alcohol at all for now.   F/u with Dr. Durene CalHunter in 7-10 days   If you develop fever, severe pain, vomiting that does not stop especially with blood please call and come in sooner. If it is night or weekend seek immediate medical attention at urgent care or ED.   Dr. Armen PickupFunches

## 2014-01-20 NOTE — Assessment & Plan Note (Signed)
A: 23 yo F with abdominal pain with hematemesis and sore throat. History is unusual. Difficult to ascertain timeline of symptoms. Exam is reassuring. Differentials include gastritis, esophageal tear from forceful emesis, pancreatitis.  P: CBC, CMP, lipase No NSAIDs or alcohol.  Schedule PPI per AVS Close f/u with primary MD: consider U preg and UDS at f/u if clinical presentation still unclear.  Recommend imaging with CT vs EGD if symptoms persist.

## 2014-01-20 NOTE — Progress Notes (Signed)
   Subjective:    Patient ID: Jeanette Nicholson, female    DOB: 01/25/1991, 23 y.o.   MRN: [id number]  HPI 23 yo F present for the SD visit to discuss the following:  1. Abdominal pain: epigastric and RUQ pain intermittent for the past month. Pain is moderate to severe associated with vomiting and hematemesis per patient (estimates 1 cup of blood) yesterday. since this episode she reports sore throat. These symptoms occur once every 2 weeks. She has been taking prilosec 20-200 mg daily with persistent symptoms. She endorse weight loss (none per chart review). She denies fever, chills, sexual activity, vaginal discharge, dysuria.   Of note: difficult to establish timeline. Initially patient reports symptoms x one month. Then she reports that yesterday's symptoms were the first of the "new year".  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   LMP 01/18/14   Surg Hx: cholecystectomy  Soc Hx: admits to occasional alcohol use (not excessive). Denies tobacco and IDU.   Review of Systems As per HPI     Objective:   Physical Exam BP 129/83  Pulse 106  Temp(Src) 98.5 F (36.9 C) (Oral)  Ht 5\' 5"  (1.651 m)  Wt 187 lb (84.823 kg)  BMI 31.12 kg/m2  LMP 01/15/2014 Wt Readings from Last 3 Encounters:  01/20/14 187 lb (84.823 kg)  08/06/13 182 lb (82.555 kg)  06/04/13 182 lb (82.555 kg)  General appearance: alert, cooperative and no distress Lungs: clear to auscultation bilaterally Heart: regular rate and rhythm, S1, S2 normal, no murmur, click, rub or gallop Abdomen: flat, soft, lap chole scars noted, NABs, TTP epigastric and RUQ. no masses, no rebound no guarding.   GI cocktail given today.  Rapid strep test: negative.     Assessment & Plan:

## 2014-01-21 ENCOUNTER — Telehealth: Payer: Self-pay | Admitting: Family Medicine

## 2014-01-21 NOTE — Telephone Encounter (Signed)
Mother called and would like to know what her daughter can take for her stomach pain? jw

## 2014-01-21 NOTE — Telephone Encounter (Signed)
Lipase slightly elevated. Sodium a touch low.  Normal AST and ALT.  Patient called.  Patient advised to f/u with PCP. She made an appt.  Advised patient to stop nexium all together (no heartburn and nexium can irritate the pancreas). Patient last had a beer a few days ago. Reiterated no alcohol.   Patient agreed with plan and voiced understanding.

## 2014-01-21 NOTE — Telephone Encounter (Signed)
LMOVM for pt to return call.  Will forward to triage nurse. [place], Maryjo RochesterJessica Dawn

## 2014-01-22 ENCOUNTER — Ambulatory Visit (INDEPENDENT_AMBULATORY_CARE_PROVIDER_SITE_OTHER): Payer: No Typology Code available for payment source | Admitting: Family Medicine

## 2014-01-22 ENCOUNTER — Encounter: Payer: Self-pay | Admitting: Family Medicine

## 2014-01-22 VITALS — BP 131/83 | HR 79 | Temp 98.6°F | Wt 187.0 lb

## 2014-01-22 DIAGNOSIS — Z23 Encounter for immunization: Secondary | ICD-10-CM

## 2014-01-22 DIAGNOSIS — R109 Unspecified abdominal pain: Secondary | ICD-10-CM

## 2014-01-22 NOTE — Patient Instructions (Signed)
1. I am sorry you have been dealing with this chronic pain. Your labs were reassuring.  2. I would try tylenol or heat on the area that is bothering you. I don't want to mask the pain with stronger medicines plus these can be addictive in the long run and usually are not beneficial long term.  3. Let's follow up in 1-2 weeks to reassess, we would consider H. Pylori testing.  3. I want you to regularly see your psychiatrist or counselor as chronic pain can be very taxing on us and I feel you need support.   Thanks for your time today and sharing your story, Dr. Durene CalHunter  Health Maintenance Due  Topic Date Due  . Pap Smear -we need to do one of these in the coming months 03/25/2010  . Influenza Vaccine  07/19/2013

## 2014-01-22 NOTE — Telephone Encounter (Signed)
Appt made for today at 3:15 PM with Dr. Durene CalHunter per pt and mother request.  Mom stated that pt has had stomach pains for weeks and [facility] and told she had something wrong with her pancreas.  Pt could not take any over the counter pain meds. Clovis PuMartin, Bergen Melle L, RN

## 2014-01-23 NOTE — Assessment & Plan Note (Signed)
Patient with recent reassuring work up by Dr. Armen PickupFunches. Patient with reassuring abdominal exam today. Nexium did not help patient. Patient with a long history of similar abdominal pain even before her cholecystectomy which has returned since that time. Further clarification does not seem to show hematemesis (patient with orange card and could not get EGD anyway unless out of pocket and family cannot afford). Patient also with history of various pains and ailments that have not had organic cause. I discussed with patient that this as well as low back pain once again are likely psychogenic in origin. I still think avoiding alcohol and NSAIDs are reasonable. I advised patient we could get H. Pylori testing on follow up in 2 weeks. Opted not to pursue further imaging in patient in the last 2 years who has had CT head, MRI head, MRI abd/pelvis, and MRI lumbar spine that has yielded little clinically relevant information.   Discussed need for close follow up as we had discussed with lumbar back pain at last visit.

## 2014-01-23 NOTE — Progress Notes (Signed)
Jeanette ConchStephen Chaley Castellanos, MD Phone: [phone]  Subjective:  Chief complaint-noted   Abdominal Pain  I reviewed the medical record with patient dating back to 10/22/12 when she had an MRI which showed cholelithiasis although no cholecystitis. She states that she was having intermittent abdominal pain and vomiting at that point. I see a note from 05/09/12 mentioning intermittent vomiting as well. She did ultimately have a cholecystecomy in May of 2014. She states her pain was better for a few months then returned. She was seen in ED on 09/18/13 for abdominal pain with intermittent vomiting that was blood streaked. Patient had labs and CXR. Received Gi cocktail with some relief and was sent home with plan for PCP follow up but did not return until- Patient was seen on 01/20/14 by Dr. Armen PickupFunches for RUQ pain, hematemesis. She had a reassuring exam. Lipase was just above upper limit normal at 76, transaminases normal, CBC normal with hgb in normal range. Nexium initially started but stopped on follow up due to elevated lipase. Patient urged to avoid alochol.   Describes band like pain across epigastric area. On further questioning, cup of blood was cup of vomit and states she thought it was blood because it was orange colored. Patient states pain has been worse over last month. Lasts most of day and rated as severe. Has 2 30 minute breaks each day. Does intermittently take NSAIDs for headache. Vomiting is 3-4x a month unintentionally (does have bulemia). Usually about coffe mug size. Not associated with meals. Not associated with certain foods.   ROS-daily bowel movements, diarrhea 2x every other week. No unintentional weight loss. No fevers/night sweats.  Past Medical History- ADD, bipolar disorder, multiple personality disorder Multiple ED and office visits for issues such as weakness or pain.  Surgical history-s/p cholecystectomy  Medications- reviewed and updated Current Outpatient Prescriptions  Medication Sig  Dispense Refill  . FLUoxetine (PROZAC) 20 MG capsule Take 40 mg by mouth daily.       . hydrOXYzine (ATARAX/VISTARIL) 25 MG tablet Take 50 mg by mouth 3 (three) times daily as needed for anxiety.       Marland Kitchen. QUEtiapine (SEROQUEL XR) 300 MG 24 hr tablet Take 150 mg by mouth at bedtime.      . topiramate (TOPAMAX) 100 MG tablet Take 100 mg by mouth 2 (two) times daily.      . TRI-SPRINTEC 0.18/0.215/0.25 MG-35 MCG tablet TAKE ONE TABLET BY MOUTH EVERY DAY  1 Package  11   No current facility-administered medications for this visit.    Objective: BP 131/83  Pulse 79  Temp(Src) 98.6 F (37 C) (Oral)  Wt 187 lb (84.823 kg)  LMP 01/15/2014 Gen: NAD, resting comfortably on table with no signs of discomfort CV: RRR no murmurs rubs or gallops Lungs: CTAB no crackles, wheeze, rhonchi Abdomen: soft/nondistended/normal bowel sounds. No rebound or guarding. No hepatosplenomegaly. Patient reports severe pain with palpation of abdomen in all quadrants worse in upper quadrants.  Ext: no edema Skin: warm, dry  Assessment/Plan:  Abdominal pain Patient with recent reassuring work up by Dr. Armen PickupFunches. Patient with reassuring abdominal exam today. Nexium did not help patient. Patient with a long history of similar abdominal pain even before her cholecystectomy which has returned since that time. Further clarification does not seem to show hematemesis (patient with orange card and could not get EGD anyway unless out of pocket and family cannot afford). Patient also with history of various pains and ailments that have not had organic cause. I discussed  with patient that this as well as low back pain once again are likely psychogenic in origin. I still think avoiding alcohol and NSAIDs are reasonable. I advised patient we could get H. Pylori testing on follow up in 2 weeks. Opted not to pursue further imaging in patient in the last 2 years who has had CT head, MRI head, MRI abd/pelvis, and MRI lumbar spine that has  yielded little clinically relevant information.   Discussed need for close follow up as we had discussed with lumbar back pain at last visit.   Patient agreeable to plan and at this time not wishing to pursue more aggressive workup.   >50% of 30 minute office visit was spent on counseling and coordination of care

## 2014-01-28 ENCOUNTER — Ambulatory Visit: Payer: No Typology Code available for payment source | Admitting: Family Medicine

## 2014-02-04 ENCOUNTER — Ambulatory Visit: Payer: No Typology Code available for payment source | Admitting: Family Medicine

## 2014-02-06 ENCOUNTER — Ambulatory Visit: Payer: No Typology Code available for payment source | Admitting: Family Medicine

## 2014-02-17 IMAGING — CR DG LUMBAR SPINE COMPLETE 4+V
5 series · 5 of 5 positions shown · non-contrast
Comparison: None

CLINICAL DATA: Acute lumbar back pain, right leg pain, and weakness

LUMBAR SPINE - COMPLETE 4+ VIEW

[t lumbar spine ap]
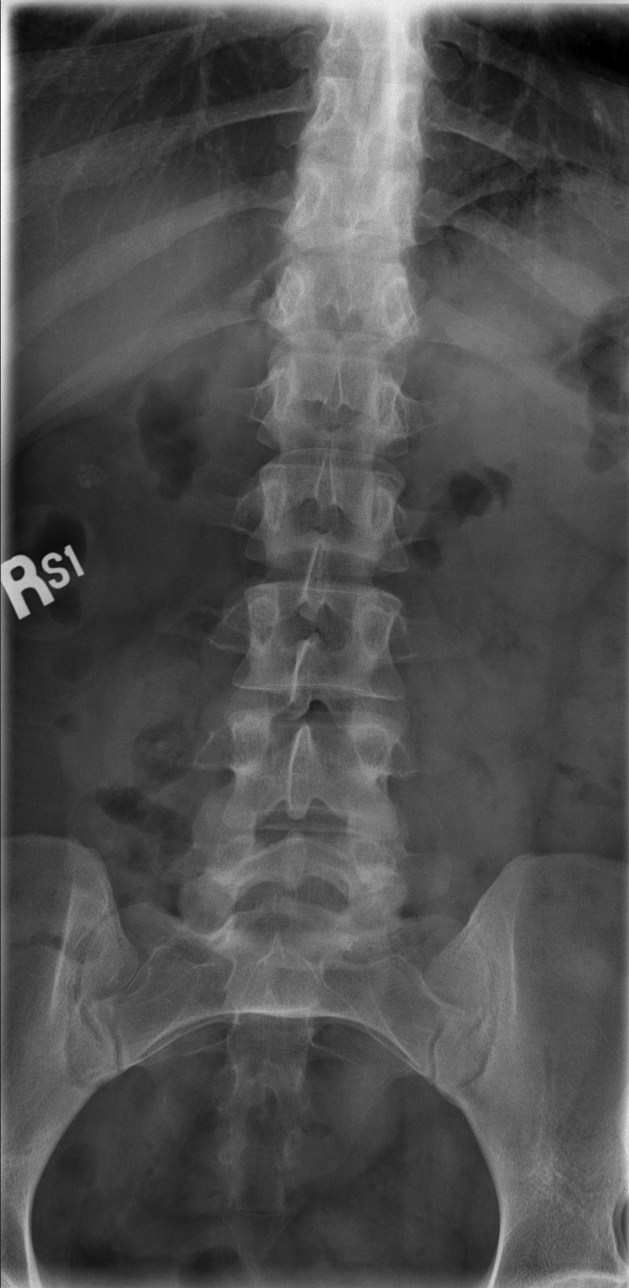

[t lumbar spine obl (1 of 2)]
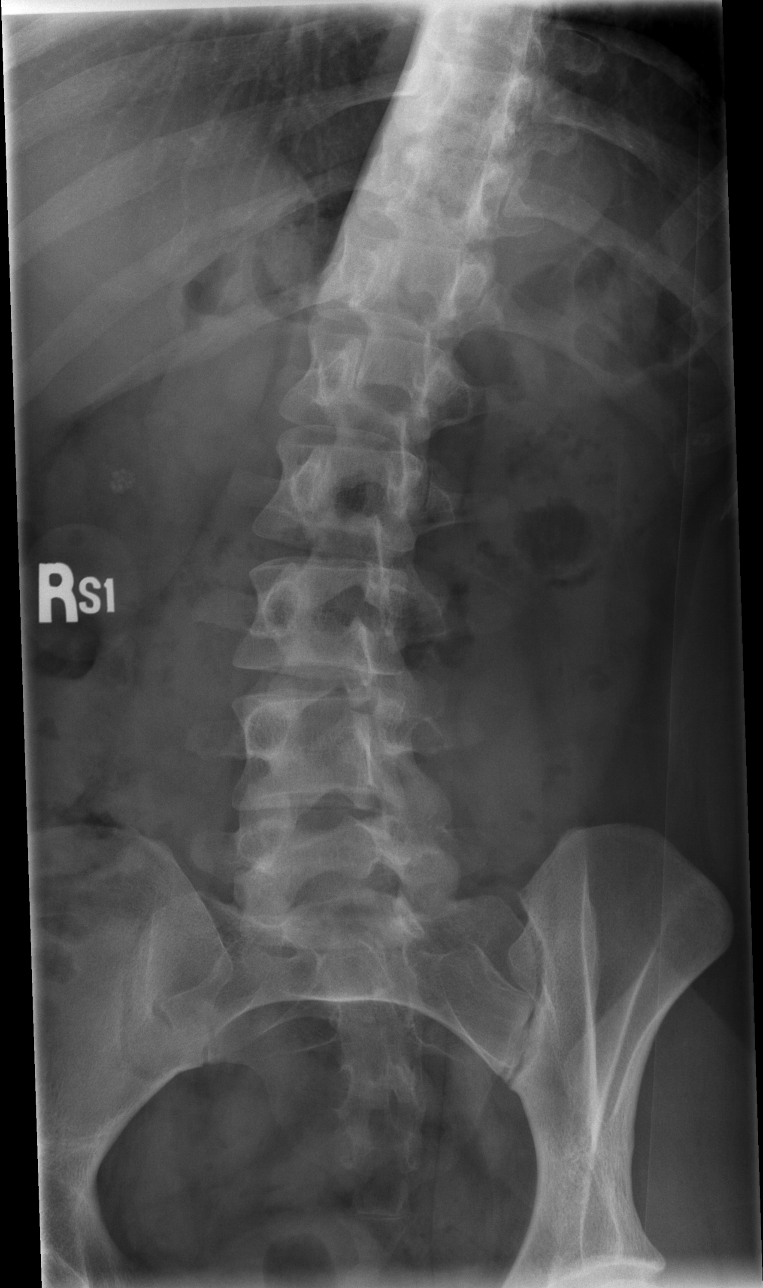

[t lumbar spine obl (2 of 2)]
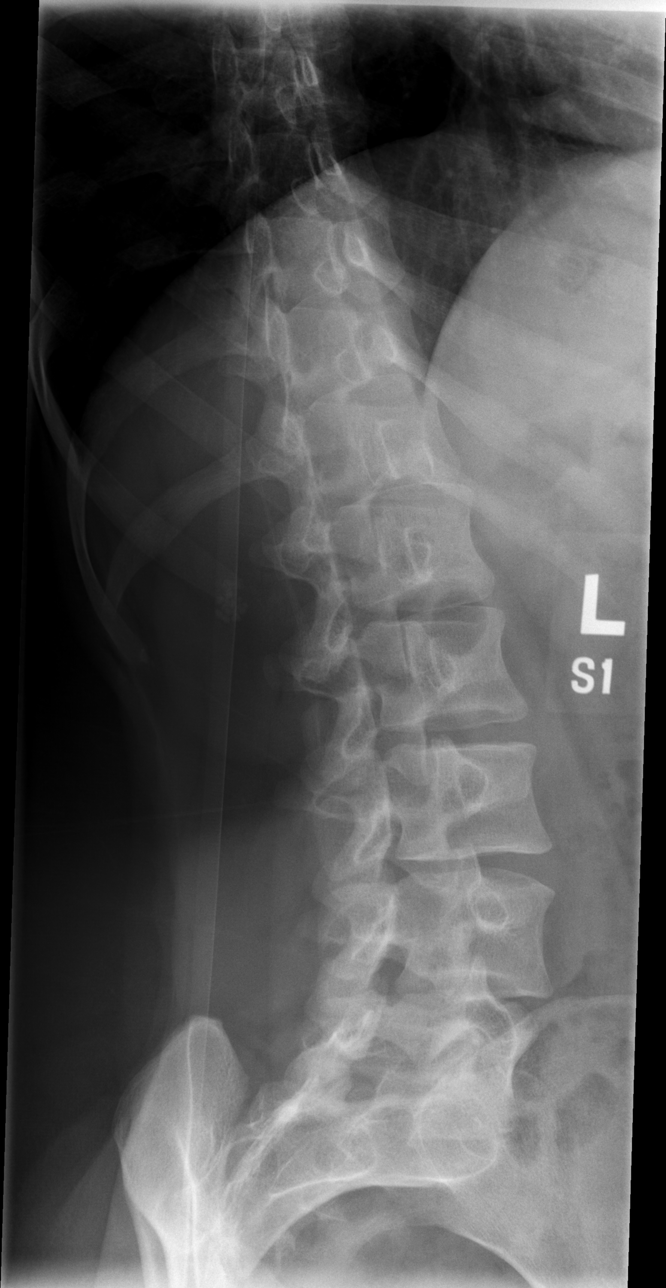

[t lumbar spine lat]
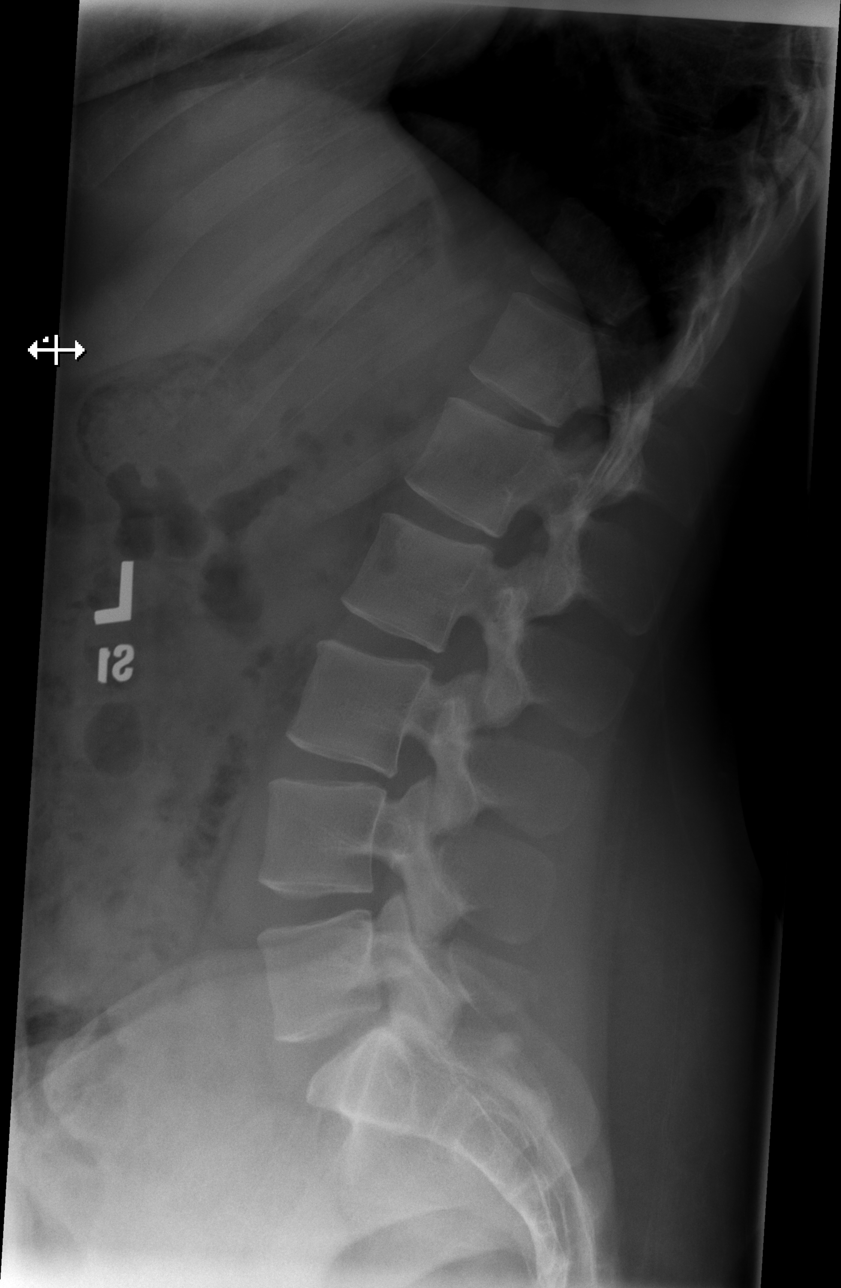

[t lumbar l-5 s-1 spot]
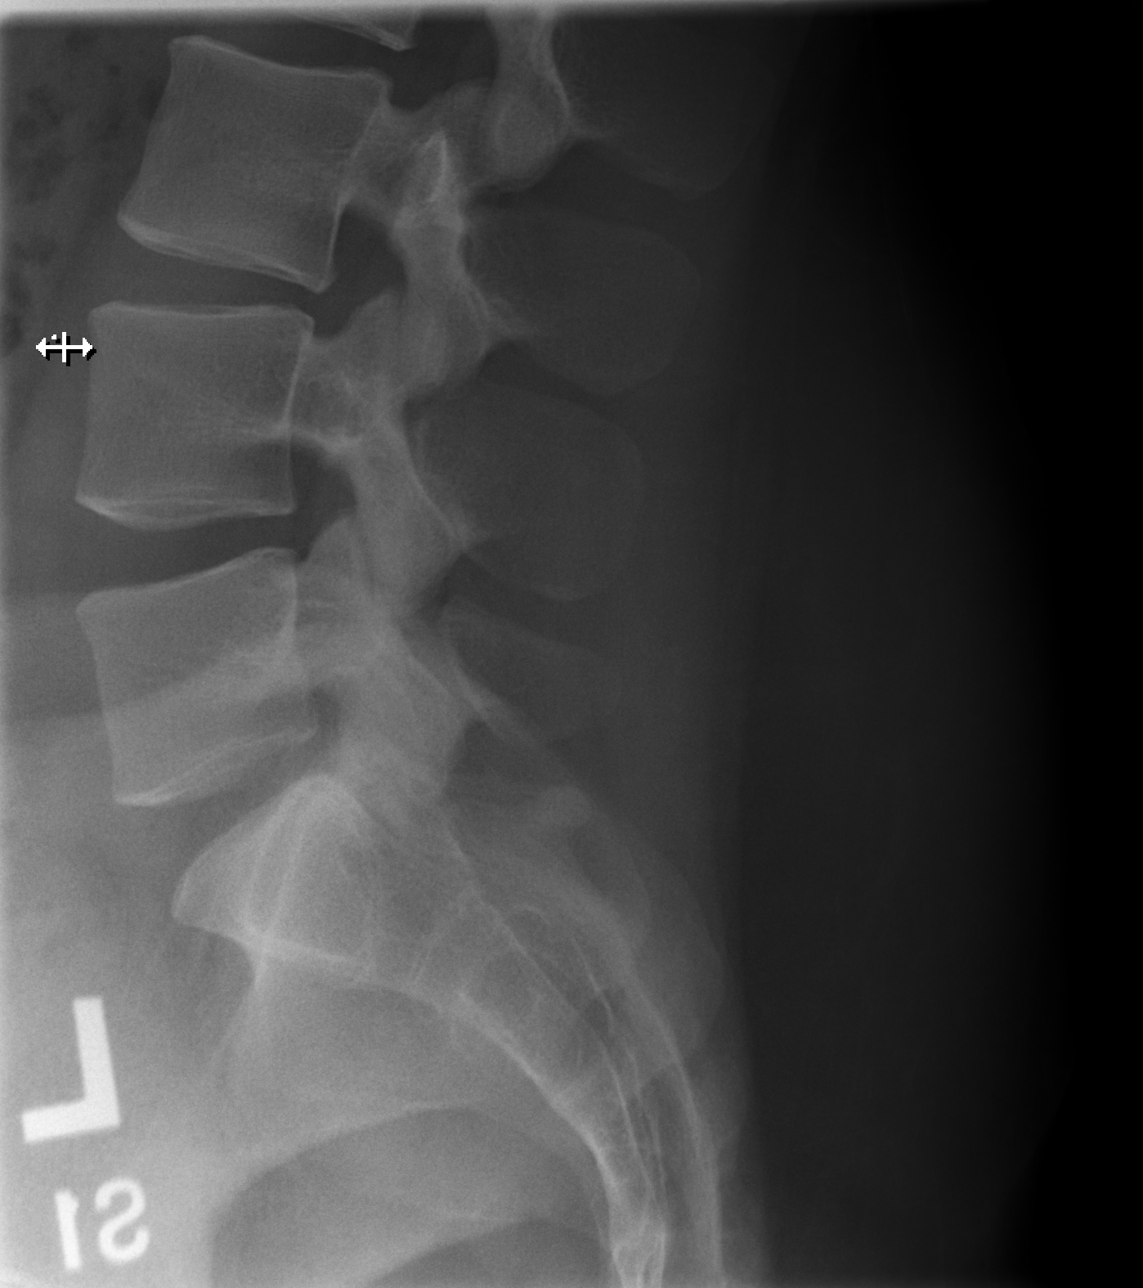

[5 of 5 positions shown; findings below may reference images not displayed]

FINDINGS: Osseous mineralization normal.
Five non-rib bearing lumbar vertebrae.
Vertebral body and disc space heights maintained.
No acute fracture, subluxation or bone destruction.
No spondylolysis.
SI joints symmetric.
Small cluster of calcifications identified right upper quadrant,
question anterior.
These are not adequately localized on lateral view.
Inferior pole of the right renal silhouette is poorly defined with
question of a rounded soft tissue opacity in the right upper
quadrant approximately 6.6 cm diameter.
This could represent a distended gallbladder or potentially be
related to a mass at the inferior pole of the right kidney.
IMPRESSION: No acute lumbar spine abnormalities.
Clustered right upper quadrant calcifications, favor
cholelithiasis.
Area of abnormal soft tissue density in the right mid abdomen
proximally 6.6 cm greatest size, the question distended gallbladder
versus mass at inferior pole of the right kidney.
Recommend assessment by ultrasound abdomen exam.

## 2014-04-09 ENCOUNTER — Ambulatory Visit: Payer: No Typology Code available for payment source | Admitting: Family Medicine

## 2015-03-29 ENCOUNTER — Encounter (HOSPITAL_COMMUNITY): Payer: Self-pay | Admitting: *Deleted

## 2015-03-29 ENCOUNTER — Emergency Department (HOSPITAL_COMMUNITY)
Admission: EM | Admit: 2015-03-29 | Discharge: 2015-03-29 | Disposition: A | Discharge status: Home / Self Care | Payer: Self-pay | Attending: Emergency Medicine | Admitting: Emergency Medicine

## 2015-03-29 ENCOUNTER — Emergency Department (HOSPITAL_COMMUNITY): Payer: Medicaid Other

## 2015-03-29 DIAGNOSIS — R109 Unspecified abdominal pain: Secondary | ICD-10-CM

## 2015-03-29 DIAGNOSIS — K219 Gastro-esophageal reflux disease without esophagitis: Secondary | ICD-10-CM | POA: Insufficient documentation

## 2015-03-29 DIAGNOSIS — F329 Major depressive disorder, single episode, unspecified: Secondary | ICD-10-CM | POA: Insufficient documentation

## 2015-03-29 DIAGNOSIS — F419 Anxiety disorder, unspecified: Secondary | ICD-10-CM | POA: Insufficient documentation

## 2015-03-29 DIAGNOSIS — N83201 Unspecified ovarian cyst, right side: Secondary | ICD-10-CM

## 2015-03-29 DIAGNOSIS — E860 Dehydration: Secondary | ICD-10-CM | POA: Insufficient documentation

## 2015-03-29 DIAGNOSIS — R112 Nausea with vomiting, unspecified: Secondary | ICD-10-CM | POA: Insufficient documentation

## 2015-03-29 DIAGNOSIS — Z3202 Encounter for pregnancy test, result negative: Secondary | ICD-10-CM | POA: Insufficient documentation

## 2015-03-29 DIAGNOSIS — Z79899 Other long term (current) drug therapy: Secondary | ICD-10-CM | POA: Insufficient documentation

## 2015-03-29 DIAGNOSIS — N8320 Unspecified ovarian cysts: Secondary | ICD-10-CM | POA: Insufficient documentation

## 2015-03-29 DIAGNOSIS — R197 Diarrhea, unspecified: Secondary | ICD-10-CM | POA: Insufficient documentation

## 2015-03-29 LAB — COMPREHENSIVE METABOLIC PANEL
ALBUMIN: 4 g/dL (ref 3.5–5.2)
ALT: 17 U/L (ref 0–35)
ANION GAP: 9 (ref 5–15)
AST: 17 U/L (ref 0–37)
Alkaline Phosphatase: 119 U/L — ABNORMAL HIGH (ref 39–117)
BUN: 10 mg/dL (ref 6–23)
CALCIUM: 9.3 mg/dL (ref 8.4–10.5)
CHLORIDE: 106 mmol/L (ref 96–112)
CO2: 21 mmol/L (ref 19–32)
CREATININE: 0.96 mg/dL (ref 0.50–1.10)
GFR calc Af Amer: >90 mL/min (ref >90)
GFR, EST NON AFRICAN AMERICAN: 83 mL/min — AB (ref >90)
Glucose, Bld: 90 mg/dL (ref 70–99)
Potassium: 3.9 mmol/L (ref 3.5–5.1)
Sodium: 136 mmol/L (ref 135–145)
TOTAL PROTEIN: 7.7 g/dL (ref 6.0–8.3)
Total Bilirubin: 0.4 mg/dL (ref 0.3–1.2)

## 2015-03-29 LAB — CBC WITH DIFFERENTIAL/PLATELET
BASOS ABS: 0 10*3/uL (ref 0.0–0.1)
Basophils Relative: 0 % (ref 0–1)
EOS ABS: 0.4 10*3/uL (ref 0.0–0.7)
Eosinophils Relative: 3 % (ref 0–5)
HCT: 44.6 % (ref 36.0–46.0)
Hemoglobin: 15.2 g/dL — ABNORMAL HIGH (ref 12.0–15.0)
LYMPHS PCT: 34 % (ref 12–46)
Lymphs Abs: 3.8 10*3/uL (ref 0.7–4.0)
MCH: 29.1 pg (ref 26.0–34.0)
MCHC: 34.1 g/dL (ref 30.0–36.0)
MCV: 85.4 fL (ref 78.0–100.0)
MONO ABS: 1 10*3/uL (ref 0.1–1.0)
Monocytes Relative: 9 % (ref 3–12)
Neutro Abs: 6 10*3/uL (ref 1.7–7.7)
Neutrophils Relative %: 54 % (ref 43–77)
Platelets: 303 10*3/uL (ref 150–400)
RBC: 5.22 MIL/uL — AB (ref 3.87–5.11)
RDW: 13.4 % (ref 11.5–15.5)
WBC: 11.2 10*3/uL — AB (ref 4.0–10.5)

## 2015-03-29 LAB — URINALYSIS, ROUTINE W REFLEX MICROSCOPIC
Bilirubin Urine: NEGATIVE
GLUCOSE, UA: NEGATIVE mg/dL
HGB URINE DIPSTICK: NEGATIVE
KETONES UR: NEGATIVE mg/dL
LEUKOCYTES UA: NEGATIVE
Nitrite: NEGATIVE
Protein, ur: NEGATIVE mg/dL
Specific Gravity, Urine: 1.025 (ref 1.005–1.030)
UROBILINOGEN UA: 0.2 mg/dL (ref 0.0–1.0)
pH: 5 (ref 5.0–8.0)

## 2015-03-29 LAB — POC OCCULT BLOOD, ED: FECAL OCCULT BLD: NEGATIVE

## 2015-03-29 LAB — POC URINE PREG, ED: Preg Test, Ur: NEGATIVE

## 2015-03-29 LAB — LIPASE, BLOOD: Lipase: 38 U/L (ref 11–59)

## 2015-03-29 MED ORDER — IOHEXOL 300 MG/ML  SOLN
100.0000 mL | Freq: Once | INTRAMUSCULAR | Status: AC | PRN
  Administered 2015-03-29: 100 mL via INTRAVENOUS

## 2015-03-29 MED ORDER — IOHEXOL 300 MG/ML  SOLN
25.0000 mL | Freq: Once | INTRAMUSCULAR | Status: AC | PRN
  Administered 2015-03-29: 25 mL via ORAL

## 2015-03-29 MED ORDER — ONDANSETRON HCL 4 MG/2ML IJ SOLN
4.0000 mg | Freq: Once | INTRAMUSCULAR | Status: AC
  Administered 2015-03-29: 4 mg via INTRAVENOUS
  Filled 2015-03-29: qty 2

## 2015-03-29 MED ORDER — HYDROXYZINE HCL 25 MG PO TABS
50.0000 mg | ORAL_TABLET | Freq: Once | ORAL | Status: AC
  Administered 2015-03-29: 50 mg via ORAL
  Filled 2015-03-29: qty 2

## 2015-03-29 MED ORDER — ONDANSETRON HCL 8 MG PO TABS: 8.0000 mg | ORAL_TABLET | Freq: Three times a day (TID) | ORAL | Status: DC | PRN

## 2015-03-29 MED ORDER — MORPHINE SULFATE 4 MG/ML IJ SOLN
4.0000 mg | Freq: Once | INTRAMUSCULAR | Status: AC
  Administered 2015-03-29: 4 mg via INTRAVENOUS
  Filled 2015-03-29: qty 1

## 2015-03-29 MED ORDER — SODIUM CHLORIDE 0.9 % IV BOLUS (SEPSIS)
1000.0000 mL | Freq: Once | INTRAVENOUS | Status: AC
  Administered 2015-03-29: 1000 mL via INTRAVENOUS

## 2015-03-29 MED ORDER — OMEPRAZOLE 20 MG PO CPDR: 20.0000 mg | DELAYED_RELEASE_CAPSULE | Freq: Every day | ORAL | Status: AC

## 2015-03-29 MED ORDER — PANTOPRAZOLE SODIUM 40 MG IV SOLR
40.0000 mg | Freq: Once | INTRAVENOUS | Status: AC
  Administered 2015-03-29: 40 mg via INTRAVENOUS
  Filled 2015-03-29: qty 40

## 2015-03-29 NOTE — ED Notes (Signed)
The pt is c/o abd pain for one month with nv and diarrhea  lmp a few days  Ago.

## 2015-03-29 NOTE — ED Notes (Signed)
Patient transported to X-ray 

## 2015-03-29 NOTE — ED Provider Notes (Signed)
CSN: [id number]     Arrival date & time 03/29/15  1559 History   First MD Initiated Contact with Patient 03/29/15 1637     Chief Complaint  Patient presents with  . Abdominal Pain     (Consider location/radiation/quality/duration/timing/severity/associated sxs/prior Treatment) HPI Comments: Jeanette Nicholson is a 24 y.o. female with a PMHx of bulimia, depression, and anxiety, with a PSHx of cholecystectomy who presents to the ED with complaints of one month of gradual onset nausea, vomiting, diarrhea, and abdominal pai. She reports the pain is 10/10 constant burning and stabbing, generalized, nonradiating pain worse with eating, and unrelieved with Pepto-Bismol. She endorses 2-4 episodes of melanotic loose stool, and 5 episodes daily of nonbloody nonbilious emesis, along with heartburn. She was seen at [facility] 2 days ago and was given IV fluids and had a negative workup but had no imaging performed, and they told her she likely had ulcer disease but did not refer her to a GI specialist. She denies any fevers, chills, chest pain, shortness breath, constipation, obstipation, hematochezia, hematemesis, dysuria, hematuria, vaginal bleeding or discharge, numbness, tingling, weakness, rashes, arthralgias, myalgias, NSAID use, sick contacts, alcohol use, antibiotics, recent travel, or suspicious food intake.   Patient is a 24 y.o. female presenting with abdominal pain. The history is provided by the patient. No language interpreter was used.  Abdominal Pain Pain location:  Generalized Pain quality: burning and stabbing   Pain radiates to:  Does not radiate Pain severity:  Severe Onset quality:  Gradual Duration:  4 weeks Timing:  Constant Progression:  Unchanged Chronicity:  New Context: eating (anything)   Context: not recent travel, not sick contacts and not suspicious food intake   Relieved by:  Nothing Worsened by:  Eating Ineffective treatments:  OTC medications (pepto  bismol) Associated symptoms: diarrhea, melena, nausea and vomiting   Associated symptoms: no chest pain, no chills, no constipation, no dysuria, no fever, no flatus, no hematemesis, no hematochezia, no hematuria, no shortness of breath, no vaginal bleeding and no vaginal discharge   Associated symptoms comment:  +heartburn Risk factors: no alcohol abuse, no NSAID use and not pregnant     Past Medical History  Diagnosis Date  . Anxiety   . Depression   . Bulimia    Past Surgical History  Procedure Laterality Date  . Cholecystectomy N/A 05/16/2013    Procedure: LAPAROSCOPIC CHOLECYSTECTOMY ;  Surgeon: Earnstine Regal, MD;  Location: [facility];  Service: General;  Laterality: N/A;   Family History  Problem Relation Age of Onset  . Depression Mother   . Mental illness Mother    History  Substance Use Topics  . Smoking status: Never Smoker   . Smokeless tobacco: Never Used  . Alcohol Use: Yes     Comment: Occasional.   OB History    No data available     Review of Systems  Constitutional: Negative for fever and chills.  Respiratory: Negative for shortness of breath.   Cardiovascular: Negative for chest pain.  Gastrointestinal: Positive for nausea, vomiting, abdominal pain, diarrhea, blood in stool (melanotic) and melena. Negative for constipation, hematochezia, rectal pain, flatus and hematemesis.  Genitourinary: Negative for dysuria, hematuria, flank pain, vaginal bleeding, vaginal discharge and menstrual problem.  Musculoskeletal: Negative for myalgias and arthralgias.  Skin: Negative for color change.  Allergic/Immunologic: Negative for immunocompromised state.  Neurological: Negative for weakness, light-headedness and numbness.  Psychiatric/Behavioral: Negative for confusion.  10 Systems reviewed and are negative for acute change except as noted  in the HPI.     Allergies  Review of patient's allergies indicates no known allergies.  Home Medications   Prior to Admission  medications   Medication Sig Start Date End Date Taking? Authorizing Provider  FLUoxetine (PROZAC) 20 MG capsule Take 40 mg by mouth daily.     Historical Provider, MD  hydrOXYzine (ATARAX/VISTARIL) 25 MG tablet Take 50 mg by mouth 3 (three) times daily as needed for anxiety.     Historical Provider, MD  QUEtiapine (SEROQUEL XR) 300 MG 24 hr tablet Take 150 mg by mouth at bedtime.    Historical Provider, MD  topiramate (TOPAMAX) 100 MG tablet Take 100 mg by mouth 2 (two) times daily.    Historical Provider, MD  TRI-SPRINTEC 0.18/0.215/0.25 MG-35 MCG tablet TAKE ONE TABLET BY MOUTH EVERY DAY 08/18/13   Marin Olp, MD   BP 112/69 mmHg  Pulse 87  Temp(Src) 97.3 F (36.3 C)  Resp 16  Ht '5\' 5"'  (1.651 m)  Wt 213 lb (96.616 kg)  BMI 35.44 kg/m2  SpO2 97%  LMP 03/26/2015 Physical Exam  Constitutional: She is oriented to person, place, and time. Vital signs are normal. She appears well-developed and well-nourished.  Non-toxic appearance. No distress.  Afebrile, nontoxic, NAD  HENT:  Head: Normocephalic and atraumatic.  Mouth/Throat: Oropharynx is clear and moist. Mucous membranes are dry.  Mildly dry mucous membranes  Eyes: Conjunctivae and EOM are normal. Right eye exhibits no discharge. Left eye exhibits no discharge.  Neck: Normal range of motion. Neck supple.  Cardiovascular: Normal rate, regular rhythm, normal heart sounds and intact distal pulses.  Exam reveals no gallop and no friction rub.   No murmur heard. Pulmonary/Chest: Effort normal and breath sounds normal. No respiratory distress. She has no decreased breath sounds. She has no wheezes. She has no rhonchi. She has no rales.  Abdominal: Soft. Normal appearance and bowel sounds are normal. She exhibits no distension. There is generalized tenderness. There is no rigidity, no rebound, no guarding, no CVA tenderness, no tenderness at McBurney's point and negative Murphy's sign.  Soft, nondistended, +BS throughout, with mild  diffuse TTP throughout entire abdomen, no r/g/r, neg murphy's, neg mcburney's, no CVA TTP   Genitourinary: Rectum normal. Rectal exam shows no external hemorrhoid, no internal hemorrhoid, no fissure, no mass, no tenderness and anal tone normal. Guaiac negative stool.  Chaperone present No gross blood noted on rectal exam, normal tone, no tenderness, no mass or fissure, no hemorrhoids. FOBT negative.  Musculoskeletal: Normal range of motion.  Neurological: She is alert and oriented to person, place, and time. She has normal strength. No sensory deficit.  Skin: Skin is warm, dry and intact. No rash noted.  Psychiatric: She has a normal mood and affect.  Nursing note and vitals reviewed.   ED Course  Procedures (including critical care time) Labs Review Labs Reviewed  CBC WITH DIFFERENTIAL/PLATELET - Abnormal; Notable for the following:    WBC 11.2 (*)    RBC 5.22 (*)    Hemoglobin 15.2 (*)    All other components within normal limits  COMPREHENSIVE METABOLIC PANEL - Abnormal; Notable for the following:    Alkaline Phosphatase 119 (*)    GFR calc non Af Amer 83 (*)    All other components within normal limits  LIPASE, BLOOD  URINALYSIS, ROUTINE W REFLEX MICROSCOPIC  POC URINE PREG, ED  POC OCCULT BLOOD, ED    Imaging Review Ct Abdomen Pelvis W Contrast  03/29/2015   CLINICAL DATA:  Rectal bleeding and tarry stools for 1 month.  EXAM: CT ABDOMEN AND PELVIS WITH CONTRAST  TECHNIQUE: Multidetector CT imaging of the abdomen and pelvis was performed using the standard protocol following bolus administration of intravenous contrast.  CONTRAST:  22m OMNIPAQUE IOHEXOL 300 MG/ML SOLN, 1084mOMNIPAQUE IOHEXOL 300 MG/ML SOLN  COMPARISON:  MRI of the abdomen 10/28/2012  FINDINGS: BODY WALL: No contributory findings.  LOWER CHEST: No contributory findings.  ABDOMEN/PELVIS:  Liver: No focal abnormality.  Biliary: Cholecystectomy.  Pancreas: Unremarkable.  Spleen: Unremarkable.  Adrenals:  Unremarkable.  Kidneys and ureters: Layering stones or milk of calcium in the interpolar right kidney, stable from 2013 MRI. No ureteral calculus or hydronephrosis.  Bladder: Unremarkable.  Reproductive: 4 cm cyst in the right ovary. On reformatted imaging there is possible layering debris or septation along the inferior margin. No concerning enlargement of the surrounding ovary.  Bowel: No inflammatory bowel wall thickening. Negative appendix.  Retroperitoneum: No mass or adenopathy.  Peritoneum: No ascites or pneumoperitoneum.  Vascular: No acute abnormality.  OSSEOUS: No acute abnormalities.  IMPRESSION: 1. No explanation for abdominal pain or tarry stools. 2. 4 cm right ovarian cyst. There is mild complexity along the inferior wall, recommend ultrasound in 6-12 weeks (which would allow for resolution of a follicular/hemorrhagic cyst). 3. 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 This finding is stable from 2013 workup.   Electronically Signed   By: JoMonte Fantasia.D.   On: 03/29/2015 23:03     EKG Interpretation None      MDM   Final diagnoses:  Abdominal pain  Nausea vomiting and diarrhea  Gastroesophageal reflux disease, esophagitis presence not specified  Dehydration  Right ovarian cyst    [age].o. female here with 1 month of periumbilical/generalized abd pain, N/V, and melanotic diarrhea. No recent travel, sick contacts, suspicious food intake, or abx. States she's needed IV fluids during multiple ED visits in [place] has no diagnosis. Has not had a CT scan performed. On exam, pt with diffuse abdominal tenderness, nonperitoneal, rectal exam with no abnormalities. Upreg neg. CBC w/diff showing hemoconcentration but no acute abnormalities. CMP and lipase pending. FOBT pending. Will proceed with CT abd/pelvis, fluids, zofran, and pain control. Will reassess shortly. Will likely need GI referral.  6:45 PM U/A unremarkable. FOBT neg. CMP  with chronic mild elevation of alk phos at 119. Lipase WNL. CT still pending. Pt feeling anxious, has home vistaril that she takes PRN, will give this now. Will reassess after CT imaging.  11:13 PM Multiple delays in CT results, but now resulted and shows no etiology for acute symptoms, has small R ovarian cyst which is nonacute and will need follow up but does not appear to be the cause of today's symptoms. Pt tolerating PO well. Will send home with prilosec and zofran, and have her use tylenol as needed for pain. Will have her f/up with GI for ongoing evaluation. I explained the diagnosis and have given explicit precautions to return to the ER including for any other new or worsening symptoms. The patient understands and accepts the medical plan as it's been dictated and I have answered their questions. Discharge instructions concerning home care and prescriptions have been given. The patient is STABLE and is discharged to home in good condition.  BP 105/61 mmHg  Pulse 69  Temp(Src) 98.4 F (36.9 C) (Oral)  Resp 16  Ht '5\' 5"'  (1.651 m)  Wt 213 lb (96.616 kg)  BMI 35.44  kg/m2  SpO2 97%  LMP 03/26/2015  Meds ordered this encounter  Medications  . ondansetron (ZOFRAN) injection 4 mg    Sig:   . sodium chloride 0.9 % bolus 1,000 mL    Sig:   . morphine 4 MG/ML injection 4 mg    Sig:   . pantoprazole (PROTONIX) injection 40 mg    Sig:   . iohexol (OMNIPAQUE) 300 MG/ML solution 25 mL    Sig:   . hydrOXYzine (ATARAX/VISTARIL) tablet 50 mg    Sig:   . iohexol (OMNIPAQUE) 300 MG/ML solution 100 mL    Sig:   . ondansetron (ZOFRAN) 8 MG tablet    Sig: Take 1 tablet (8 mg total) by mouth every 8 (eight) hours as needed for nausea or vomiting.    Dispense:  10 tablet    Refill:  0    Order Specific Question:  Supervising Provider    Answer:  Sabra Heck, BRIAN [[id number]]  . omeprazole (PRILOSEC) 20 MG capsule    Sig: Take 1 capsule (20 mg total) by mouth daily.    Dispense:  30 capsule    Refill:   0    Order Specific Question:  Supervising Provider    Answer:  Noemi Chapel [[id number]]     Nakyia Dau Camprubi-Soms, PA-C 03/29/15 2315  Lacretia Leigh, MD 03/30/15 0028

## 2015-03-29 NOTE — Discharge Instructions (Signed)
Your work up did not show any acute findings to explain your symptoms. You did have a small ovarian cyst but this doesn't explain your symptoms, and typically does not need any further treatment emergently. You can follow up with your regular doctor for this. For your abdominal pain and nausea, you will need to see a gastroenterologist for follow up and ongoing evaluation. Start taking prilosec daily as directed. Avoid spicy or fatty foods. Stay well hydrated. Use zofran as needed for nausea. Use tylenol as needed for pain, but avoid NSAIDs like ibuprofen or aleve. Return to the ER for changes or worsening symptoms.  Abdominal (belly) pain can be caused by many things. Your caregiver performed an examination and possibly ordered blood/urine tests and imaging (CT scan, x-rays, ultrasound). Many cases can be observed and treated at home after initial evaluation in the emergency department. Even though you are being discharged home, abdominal pain can be unpredictable. Therefore, you need a repeated exam if your pain does not resolve, returns, or worsens. Most patients with abdominal pain don't have to be admitted to the hospital or have surgery, but serious problems like appendicitis and gallbladder attacks can start out as nonspecific pain. Many abdominal conditions cannot be diagnosed in one visit, so follow-up evaluations are very important. SEEK IMMEDIATE MEDICAL ATTENTION IF YOU DEVELOP ANY OF THE FOLLOWING SYMPTOMS:  The pain does not go away or becomes severe.   A temperature above 101 develops.   Repeated vomiting occurs (multiple episodes).   The pain becomes localized to portions of the abdomen. The right side could possibly be appendicitis. In an adult, the left lower portion of the abdomen could be colitis or diverticulitis.   Blood is being passed in stools or vomit (bright red or black tarry stools).   Return also if you develop chest pain, difficulty breathing, dizziness or fainting, or  become confused, poorly responsive, or inconsolable (young children).  The constipation stays for more than 4 days.   There is belly (abdominal) or rectal pain.   You do not seem to be getting better.     Abdominal Pain, Women Abdominal (stomach, pelvic, or belly) pain can be caused by many things. It is important to tell your doctor:  The location of the pain.  Does it come and go or is it present all the time?  Are there things that start the pain (eating certain foods, exercise)?  Are there other symptoms associated with the pain (fever, nausea, vomiting, diarrhea)? All of this is helpful to know when trying to find the cause of the pain. CAUSES   Stomach: virus or bacteria infection, or ulcer.  Intestine: appendicitis (inflamed appendix), regional ileitis (Crohn's disease), ulcerative colitis (inflamed colon), irritable bowel syndrome, diverticulitis (inflamed diverticulum of the colon), or cancer of the stomach or intestine.  Gallbladder disease or stones in the gallbladder.  Kidney disease, kidney stones, or infection.  Pancreas infection or cancer.  Fibromyalgia (pain disorder).  Diseases of the female organs:  Uterus: fibroid (non-cancerous) tumors or infection.  Fallopian tubes: infection or tubal pregnancy.  Ovary: cysts or tumors.  Pelvic adhesions (scar tissue).  Endometriosis (uterus lining tissue growing in the pelvis and on the pelvic organs).  Pelvic congestion syndrome (female organs filling up with blood just before the menstrual period).  Pain with the menstrual period.  Pain with ovulation (producing an egg).  Pain with an IUD (intrauterine device, birth control) in the uterus.  Cancer of the female organs.  Functional pain (pain  not caused by a disease, may improve without treatment).  Psychological pain.  Depression. DIAGNOSIS  Your doctor will decide the seriousness of your pain by doing an examination.  Blood  tests.  X-rays.  Ultrasound.  CT scan (computed tomography, special type of X-ray).  MRI (magnetic resonance imaging).  Cultures, for infection.  Barium enema (dye inserted in the large intestine, to better view it with X-rays).  Colonoscopy (looking in intestine with a lighted tube).  Laparoscopy (minor surgery, looking in abdomen with a lighted tube).  Major abdominal exploratory surgery (looking in abdomen with a large incision). TREATMENT  The treatment will depend on the cause of the pain.   Many cases can be observed and treated at home.  Over-the-counter medicines recommended by your caregiver.  Prescription medicine.  Antibiotics, for infection.  Birth control pills, for painful periods or for ovulation pain.  Hormone treatment, for endometriosis.  Nerve blocking injections.  Physical therapy.  Antidepressants.  Counseling with a psychologist or psychiatrist.  Minor or major surgery. HOME CARE INSTRUCTIONS   Do not take laxatives, unless directed by your caregiver.  Take over-the-counter pain medicine only if ordered by your caregiver. Do not take aspirin because it can cause an upset stomach or bleeding.  Try a clear liquid diet (broth or water) as ordered by your caregiver. Slowly move to a bland diet, as tolerated, if the pain is related to the stomach or intestine.  Have a thermometer and take your temperature several times a day, and record it.  Bed rest and sleep, if it helps the pain.  Avoid sexual intercourse, if it causes pain.  Avoid stressful situations.  Keep your follow-up appointments and tests, as your caregiver orders.  If the pain does not go away with medicine or surgery, you may try:  Acupuncture.  Relaxation exercises (yoga, meditation).  Group therapy.  Counseling. SEEK MEDICAL CARE IF:   You notice certain foods cause stomach pain.  Your home care treatment is not helping your pain.  You need stronger pain  medicine.  You want your IUD removed.  You feel faint or lightheaded.  You develop nausea and vomiting.  You develop a rash.  You are having side effects or an allergy to your medicine. SEEK IMMEDIATE MEDICAL CARE IF:   Your pain does not go away or gets worse.  You have a fever.  Your pain is felt only in portions of the abdomen. The right side could possibly be appendicitis. The left lower portion of the abdomen could be colitis or diverticulitis.  You are passing blood in your stools (bright red or black tarry stools, with or without vomiting).  You have blood in your urine.  You develop chills, with or without a fever.  You pass out. MAKE SURE YOU:   Understand these instructions.  Will watch your condition.  Will get help right away if you are not doing well or get worse. Document Released: 10/02/2007 Document Revised: 04/21/2014 Document Reviewed: 10/22/2009 [employer] Patient Information 2015 [place], [place]. This information is not intended to replace advice given to you by your health care provider. Make sure you discuss any questions you have with your health care provider.   Nausea and Vomiting Nausea means you feel sick to your stomach. Throwing up (vomiting) is a reflex where stomach contents come out of your mouth. HOME CARE   Take medicine as told by your doctor.  Do not force yourself to eat. However, you do need to drink fluids.  If you feel like eating, eat a normal diet as told by your doctor.  Eat rice, wheat, potatoes, bread, lean meats, yogurt, fruits, and vegetables.  Avoid high-fat foods.  Drink enough fluids to keep your pee (urine) clear or pale yellow.  Ask your doctor how to replace body fluid losses (rehydrate). Signs of body fluid loss (dehydration) include:  Feeling very thirsty.  Dry lips and mouth.  Feeling dizzy.  Dark pee.  Peeing less than normal.  Feeling confused.  Fast breathing or heart rate. GET HELP RIGHT AWAY  IF:   You have blood in your throw up.  You have black or bloody poop (stool).  You have a bad headache or stiff neck.  You feel confused.  You have bad belly (abdominal) pain.  You have chest pain or trouble breathing.  You do not pee at least once every 8 hours.  You have cold, clammy skin.  You keep throwing up after 24 to 48 hours.  You have a fever. MAKE SURE YOU:   Understand these instructions.  Will watch your condition.  Will get help right away if you are not doing well or get worse. Document Released: 05/23/2008 Document Revised: 02/27/2012 Document Reviewed: 05/06/2011 [facility] Patient Information 2015 [place], [place]. This information is not intended to replace advice given to you by your health care provider. Make sure you discuss any questions you have with your health care provider.

## 2015-03-29 NOTE — ED Provider Notes (Signed)
Medical screening examination/treatment/procedure(s) were conducted as a shared visit with non-physician practitioner(s) and myself.  I personally evaluated the patient during the encounter.   EKG Interpretation None     Patient here with crampy diffuse abdominal pain 1 month. Seen at another facility for similar symptoms 2 days ago diagnosed with possible ulcer disease. Notes subjective bloody stools but guaiac negative here. Will obtain abdominal CT. Abdominal exam is nonsurgical at this time  Lorre NickAnthony Verdell Kincannon, MD 03/29/15 1805

## 2017-12-08 ENCOUNTER — Ambulatory Visit: Payer: Self-pay | Admitting: Family Medicine

## 2018-09-01 DIAGNOSIS — R1030 Lower abdominal pain, unspecified: Secondary | ICD-10-CM | POA: Insufficient documentation

## 2018-09-01 DIAGNOSIS — R109 Unspecified abdominal pain: Secondary | ICD-10-CM | POA: Diagnosis not present

## 2018-09-01 DIAGNOSIS — F419 Anxiety disorder, unspecified: Secondary | ICD-10-CM | POA: Diagnosis not present

## 2018-09-01 DIAGNOSIS — Z91040 Latex allergy status: Secondary | ICD-10-CM | POA: Insufficient documentation

## 2018-09-01 DIAGNOSIS — F909 Attention-deficit hyperactivity disorder, unspecified type: Secondary | ICD-10-CM | POA: Diagnosis not present

## 2018-09-01 DIAGNOSIS — Z9049 Acquired absence of other specified parts of digestive tract: Secondary | ICD-10-CM | POA: Diagnosis not present

## 2018-09-01 DIAGNOSIS — Z79899 Other long term (current) drug therapy: Secondary | ICD-10-CM | POA: Insufficient documentation

## 2018-09-01 DIAGNOSIS — F319 Bipolar disorder, unspecified: Secondary | ICD-10-CM | POA: Diagnosis not present

## 2018-09-01 DIAGNOSIS — N200 Calculus of kidney: Secondary | ICD-10-CM | POA: Diagnosis not present

## 2018-09-01 DIAGNOSIS — R1032 Left lower quadrant pain: Secondary | ICD-10-CM | POA: Diagnosis present

## 2018-09-02 ENCOUNTER — Emergency Department (HOSPITAL_COMMUNITY): Payer: BLUE CROSS/BLUE SHIELD

## 2018-09-02 ENCOUNTER — Encounter (HOSPITAL_COMMUNITY): Payer: Self-pay | Admitting: *Deleted

## 2018-09-02 ENCOUNTER — Other Ambulatory Visit: Payer: Self-pay

## 2018-09-02 ENCOUNTER — Emergency Department (HOSPITAL_COMMUNITY)
Admission: EM | Admit: 2018-09-02 | Discharge: 2018-09-02 | Disposition: A | Discharge status: Home / Self Care | Payer: BLUE CROSS/BLUE SHIELD | Attending: Emergency Medicine | Admitting: Emergency Medicine

## 2018-09-02 DIAGNOSIS — N200 Calculus of kidney: Secondary | ICD-10-CM | POA: Diagnosis not present

## 2018-09-02 DIAGNOSIS — R109 Unspecified abdominal pain: Secondary | ICD-10-CM

## 2018-09-02 LAB — URINALYSIS, ROUTINE W REFLEX MICROSCOPIC
BILIRUBIN URINE: NEGATIVE
Glucose, UA: NEGATIVE mg/dL
HGB URINE DIPSTICK: NEGATIVE
Ketones, ur: NEGATIVE mg/dL
NITRITE: NEGATIVE
PH: 5 (ref 5.0–8.0)
Protein, ur: NEGATIVE mg/dL
SPECIFIC GRAVITY, URINE: 1.02 (ref 1.005–1.030)

## 2018-09-02 LAB — I-STAT CHEM 8, ED
BUN: 11 mg/dL (ref 6–20)
CALCIUM ION: 1.22 mmol/L (ref 1.15–1.40)
CHLORIDE: 105 mmol/L (ref 98–111)
Creatinine, Ser: 0.8 mg/dL (ref 0.44–1.00)
Glucose, Bld: 83 mg/dL (ref 70–99)
HCT: 40 % (ref 36.0–46.0)
Hemoglobin: 13.6 g/dL (ref 12.0–15.0)
POTASSIUM: 3.9 mmol/L (ref 3.5–5.1)
Sodium: 139 mmol/L (ref 135–145)
TCO2: 22 mmol/L (ref 22–32)

## 2018-09-02 LAB — POC URINE PREG, ED: PREG TEST UR: NEGATIVE

## 2018-09-02 MED ORDER — MORPHINE SULFATE (PF) 4 MG/ML IV SOLN
4.0000 mg | Freq: Once | INTRAVENOUS | Status: AC
  Administered 2018-09-02: 4 mg via INTRAVENOUS
  Filled 2018-09-02: qty 1

## 2018-09-02 MED ORDER — ONDANSETRON 4 MG PO TBDP: 4.0000 mg | ORAL_TABLET | Freq: Three times a day (TID) | ORAL | 0 refills | Status: DC | PRN

## 2018-09-02 MED ORDER — ONDANSETRON HCL 4 MG/2ML IJ SOLN
4.0000 mg | Freq: Once | INTRAMUSCULAR | Status: AC
  Administered 2018-09-02: 4 mg via INTRAVENOUS
  Filled 2018-09-02: qty 2

## 2018-09-02 NOTE — ED Triage Notes (Signed)
Pt stated "I've been having pain in my back (pt indicates left flank) for the last week.  When I had gallbladder surgery, they said they saw a stone but that was about 6 years ago."  Pt denies dysuria but stated "my pee has been cloudy."

## 2018-09-02 NOTE — ED Provider Notes (Signed)
[facility]-EMERGENCY DEPT Provider Note   CSN: [id number] Arrival date & time: 09/01/18  2348     History   Chief Complaint Chief Complaint  Patient presents with  . Flank Pain    HPI Jeanette Nicholson is a 27 y.o. female.  Patient presents to the emergency department with left flank pain.  She states that she has had the pain for about the last week.  She describes it as a dull pressure.  She states sometimes it is severe.  She denies any dysuria or hematuria.  Denies any history of kidney stones.  She has not taken anything for symptoms.  There are no alleviating factors.  The pain is intermittent.  The history is provided by the patient. No language interpreter was used.    Past Medical History:  Diagnosis Date  . Anxiety   . Bulimia   . Depression     Patient Active Problem List   Diagnosis Date Noted  . Abdominal pain 01/20/2014  . ADD (attention deficit disorder) 08/06/2013  . Nephrolithiasis 11/01/2012  . Contraception 10/21/2012  . Lumbar back pain 10/10/2012  . Multiple personality disorder (HCC) 08/13/2012  . Overactive bladder 05/01/2011  . Bipolar disorder (HCC) 05/01/2011    Past Surgical History:  Procedure Laterality Date  . CHOLECYSTECTOMY N/A 05/16/2013   Procedure: LAPAROSCOPIC CHOLECYSTECTOMY ;  Surgeon: Velora Hecklerodd M Gerkin, MD;  Location: [facility];  Service: General;  Laterality: N/A;     OB History   None      Home Medications    Prior to Admission medications   Medication Sig Start Date End Date Taking? Authorizing Provider  FLUoxetine (PROZAC) 20 MG capsule Take 40 mg by mouth daily.     [provider]  hydrOXYzine (ATARAX/VISTARIL) 25 MG tablet Take 50 mg by mouth 3 (three) times daily as needed for anxiety.     [provider]  omeprazole (PRILOSEC) 20 MG capsule Take 1 capsule (20 mg total) by mouth daily. 03/29/15   Street, Mercedes, PA-C  ondansetron (ZOFRAN) 8 MG tablet Take 1 tablet (8 mg total)  by mouth every 8 (eight) hours as needed for nausea or vomiting. 03/29/15   Street, Mercedes, PA-C  QUEtiapine (SEROQUEL XR) 300 MG 24 hr tablet Take 150 mg by mouth at bedtime.    [provider]  topiramate (TOPAMAX) 100 MG tablet Take 100 mg by mouth 2 (two) times daily.    [provider]  TRI-SPRINTEC 0.18/0.215/0.25 MG-35 MCG tablet TAKE ONE TABLET BY MOUTH EVERY DAY Patient not taking: Reported on 03/29/2015 08/18/13   Shelva MajesticHunter, Stephen O, MD    Family History Family History  Problem Relation Age of Onset  . Depression Mother   . Mental illness Mother     Social History Social History   Tobacco Use  . Smoking status: Never Smoker  . Smokeless tobacco: Never Used  Substance Use Topics  . Alcohol use: Yes    Comment: Occasional.  . Drug use: Yes    Types: Marijuana     Allergies   Abilify [aripiprazole] and Latex   Review of Systems Review of Systems  All other systems reviewed and are negative.    Physical Exam Updated Vital Signs BP 118/80   Pulse 71   Temp 98.9 F (37.2 C) (Oral)   Resp 15   Ht 5\' 5"  (1.651 m)   Wt 70.3 kg   LMP 08/13/2018 (Approximate)   SpO2 98%   BMI 25.79 kg/m  Physical Exam  Constitutional: She is oriented to person, place, and time. She appears well-developed and well-nourished.  HENT:  Head: Normocephalic and atraumatic.  Eyes: Pupils are equal, round, and reactive to light. Conjunctivae and EOM are normal.  Neck: Normal range of motion. Neck supple.  Cardiovascular: Normal rate and regular rhythm. Exam reveals no gallop and no friction rub.  No murmur heard. Pulmonary/Chest: Effort normal and breath sounds normal. No respiratory distress. She has no wheezes. She has no rales. She exhibits no tenderness.  Abdominal: Soft. Bowel sounds are normal. She exhibits no distension and no mass. There is no tenderness. There is no rebound and no guarding.  Mild left-sided CVA tenderness No focal abdominal tenderness    Musculoskeletal: Normal range of motion. She exhibits no edema or tenderness.  Neurological: She is alert and oriented to person, place, and time.  Skin: Skin is warm and dry.  Psychiatric: She has a normal mood and affect. Her behavior is normal. Judgment and thought content normal.  Nursing note and vitals reviewed.    ED Treatments / Results  Labs (all labs ordered are listed, but only abnormal results are displayed) Labs Reviewed  URINALYSIS, ROUTINE W REFLEX MICROSCOPIC - Abnormal; Notable for the following components:      Result Value   Leukocytes, UA MODERATE (*)    Bacteria, UA RARE (*)    All other components within normal limits  POC URINE PREG, ED  I-STAT CHEM 8, ED    EKG None  Radiology No results found.  Procedures Procedures (including critical care time)  Medications Ordered in ED Medications  morphine 4 MG/ML injection 4 mg (has no administration in time range)  ondansetron (ZOFRAN) injection 4 mg (has no administration in time range)     Initial Impression / Assessment and Plan / ED Course  I have reviewed the triage vital signs and the nursing notes.  Pertinent labs & imaging results that were available during my care of the patient were reviewed by me and considered in my medical decision making (see chart for details).     Patient with left flank pain.  She does have some CVA tenderness.  Question kidney stone.  Will check CT renal.  CT shows punctate nonobstructing stones in the right kidney, no left-sided renal stones or obstructive uropathy.  Patient does have some fluid-filled small bowel loops, which could be enteritis.  No evidence of acute abdomen.  Patient is well-appearing.  Will discharge to home.  Final Clinical Impressions(s) / ED Diagnoses   Final diagnoses:  Flank pain    ED Discharge Orders    None       Roxy Horseman, PA-C 09/02/18 1610    Devoria Albe, MD 09/02/18 [phone]

## 2018-09-07 ENCOUNTER — Emergency Department (HOSPITAL_COMMUNITY)
Admission: EM | Admit: 2018-09-07 | Discharge: 2018-09-07 | Disposition: A | Discharge status: Home / Self Care | Payer: BLUE CROSS/BLUE SHIELD | Attending: Emergency Medicine | Admitting: Emergency Medicine

## 2018-09-07 ENCOUNTER — Encounter (HOSPITAL_COMMUNITY): Payer: Self-pay

## 2018-09-07 ENCOUNTER — Other Ambulatory Visit: Payer: Self-pay

## 2018-09-07 DIAGNOSIS — R110 Nausea: Secondary | ICD-10-CM | POA: Insufficient documentation

## 2018-09-07 DIAGNOSIS — Z91040 Latex allergy status: Secondary | ICD-10-CM | POA: Insufficient documentation

## 2018-09-07 DIAGNOSIS — Z87891 Personal history of nicotine dependence: Secondary | ICD-10-CM | POA: Diagnosis not present

## 2018-09-07 LAB — COMPREHENSIVE METABOLIC PANEL
ALK PHOS: 65 U/L (ref 38–126)
ALT: 16 U/L (ref 0–44)
ANION GAP: 9 (ref 5–15)
AST: 17 U/L (ref 15–41)
Albumin: 4 g/dL (ref 3.5–5.0)
BUN: 7 mg/dL (ref 6–20)
CALCIUM: 9.1 mg/dL (ref 8.9–10.3)
CO2: 25 mmol/L (ref 22–32)
Chloride: 108 mmol/L (ref 98–111)
Creatinine, Ser: 0.74 mg/dL (ref 0.44–1.00)
GFR calc Af Amer: >60 mL/min (ref >60)
GFR calc non Af Amer: >60 mL/min (ref >60)
Glucose, Bld: 88 mg/dL (ref 70–99)
Potassium: 4.1 mmol/L (ref 3.5–5.1)
SODIUM: 142 mmol/L (ref 135–145)
Total Bilirubin: 0.6 mg/dL (ref 0.3–1.2)
Total Protein: 7 g/dL (ref 6.5–8.1)

## 2018-09-07 LAB — CBC WITH DIFFERENTIAL/PLATELET
Basophils Absolute: 0 10*3/uL (ref 0.0–0.1)
Basophils Relative: 0 %
EOS ABS: 0.3 10*3/uL (ref 0.0–0.7)
Eosinophils Relative: 3 %
HCT: 39.6 % (ref 36.0–46.0)
HEMOGLOBIN: 13 g/dL (ref 12.0–15.0)
LYMPHS PCT: 31 %
Lymphs Abs: 2.3 10*3/uL (ref 0.7–4.0)
MCH: 29.7 pg (ref 26.0–34.0)
MCHC: 32.8 g/dL (ref 30.0–36.0)
MCV: 90.6 fL (ref 78.0–100.0)
MONO ABS: 0.4 10*3/uL (ref 0.1–1.0)
MONOS PCT: 6 %
NEUTROS ABS: 4.5 10*3/uL (ref 1.7–7.7)
Neutrophils Relative %: 60 %
Platelets: 282 10*3/uL (ref 150–400)
RBC: 4.37 MIL/uL (ref 3.87–5.11)
RDW: 13.3 % (ref 11.5–15.5)
WBC: 7.5 10*3/uL (ref 4.0–10.5)

## 2018-09-07 LAB — I-STAT BETA HCG BLOOD, ED (MC, WL, AP ONLY): I-stat hCG, quantitative: <5 m[IU]/mL (ref <5)

## 2018-09-07 LAB — RAPID URINE DRUG SCREEN, HOSP PERFORMED
Amphetamines: NOT DETECTED
Barbiturates: NOT DETECTED
Benzodiazepines: NOT DETECTED
Cocaine: NOT DETECTED
Opiates: NOT DETECTED
Tetrahydrocannabinol: POSITIVE — AB

## 2018-09-07 MED ORDER — ONDANSETRON 4 MG PO TBDP
4.0000 mg | ORAL_TABLET | Freq: Once | ORAL | Status: AC
  Administered 2018-09-07: 4 mg via ORAL
  Filled 2018-09-07: qty 1

## 2018-09-07 MED ORDER — ONDANSETRON 4 MG PO TBDP: 4.0000 mg | ORAL_TABLET | Freq: Three times a day (TID) | ORAL | 0 refills | Status: DC | PRN

## 2018-09-07 NOTE — ED Provider Notes (Signed)
[facility]-EMERGENCY DEPT Provider Note   CSN: [id number] Arrival date & time: 09/07/18  1529     History   Chief Complaint Chief Complaint  Patient presents with  . Nausea    HPI Jeanette Nicholson is a 27 y.o. female.  HPI Patient states she took 1 "hit" of a vape pen containing what she thought was THC.  States that since that time she is been having symptoms of euphoria intermixed with nausea.  She denies any shortness of breath, cough or chest pain.  Denies any other coingestants.  No abdominal pain, vomiting or diarrhea. Past Medical History:  Diagnosis Date  . Anxiety   . Bulimia   . Depression     Patient Active Problem List   Diagnosis Date Noted  . Abdominal pain 01/20/2014  . ADD (attention deficit disorder) 08/06/2013  . Nephrolithiasis 11/01/2012  . Contraception 10/21/2012  . Lumbar back pain 10/10/2012  . Multiple personality disorder (HCC) 08/13/2012  . Overactive bladder 05/01/2011  . Bipolar disorder (HCC) 05/01/2011    Past Surgical History:  Procedure Laterality Date  . CHOLECYSTECTOMY N/A 05/16/2013   Procedure: LAPAROSCOPIC CHOLECYSTECTOMY ;  Surgeon: Velora Hecklerodd M Gerkin, MD;  Location: [facility];  Service: General;  Laterality: N/A;     OB History   None      Home Medications    Prior to Admission medications   Medication Sig Start Date End Date Taking? Authorizing Provider  omeprazole (PRILOSEC) 20 MG capsule Take 1 capsule (20 mg total) by mouth daily. Patient not taking: Reported on 09/07/2018 03/29/15   Street, HerrickMercedes, PA-C  ondansetron (ZOFRAN ODT) 4 MG disintegrating tablet Take 1 tablet (4 mg total) by mouth every 8 (eight) hours as needed for nausea or vomiting. 09/07/18   Loren RacerYelverton, Georgios Kina, MD  ondansetron (ZOFRAN) 8 MG tablet Take 1 tablet (8 mg total) by mouth every 8 (eight) hours as needed for nausea or vomiting. Patient not taking: Reported on 09/07/2018 03/29/15   Street, [place], [place]-C  TRI-SPRINTEC  0.18/0.215/0.25 MG-35 MCG tablet TAKE ONE TABLET BY MOUTH EVERY DAY Patient not taking: Reported on 03/29/2015 08/18/13   Shelva MajesticHunter, Stephen O, MD    Family History Family History  Problem Relation Age of Onset  . Depression Mother   . Mental illness Mother     Social History Social History   Tobacco Use  . Smoking status: Former Smoker    Types: Cigarettes  . Smokeless tobacco: Never Used  Substance Use Topics  . Alcohol use: Yes    Comment: Occasional.  . Drug use: Yes    Types: Marijuana     Allergies   Abilify [aripiprazole]; Morphine and related; and Latex   Review of Systems Review of Systems  Constitutional: Negative for chills and fever.  HENT: Negative for sore throat and trouble swallowing.   Eyes: Negative for visual disturbance.  Respiratory: Negative for cough, chest tightness and shortness of breath.   Cardiovascular: Negative for chest pain, palpitations and leg swelling.  Gastrointestinal: Positive for nausea. Negative for abdominal pain, constipation, diarrhea and vomiting.  Genitourinary: Negative for dysuria, flank pain and frequency.  Musculoskeletal: Negative for back pain, myalgias and neck pain.  Skin: Negative for rash and wound.  Neurological: Negative for dizziness, seizures, syncope, weakness, light-headedness, numbness and headaches.  Psychiatric/Behavioral: Positive for dysphoric mood. Negative for hallucinations. The patient is nervous/anxious.   All other systems reviewed and are negative.    Physical Exam Updated Vital Signs BP 113/70  Pulse (!) 51   Temp 97.9 F (36.6 C) (Oral)   Resp 14   Ht 5\' 5"  (1.651 m)   Wt 70.3 kg   LMP 08/13/2018 (Approximate)   SpO2 96%   BMI 25.79 kg/m   Physical Exam  Constitutional: She is oriented to person, place, and time. She appears well-developed and well-nourished. No distress.  HENT:  Head: Normocephalic and atraumatic.  Mouth/Throat: Oropharynx is clear and moist. No oropharyngeal  exudate.  Eyes: Pupils are equal, round, and reactive to light. EOM are normal.  Neck: Normal range of motion. Neck supple. No JVD present.  Cardiovascular: Normal rate and regular rhythm. Exam reveals no gallop and no friction rub.  No murmur heard. Pulmonary/Chest: Effort normal and breath sounds normal.  Abdominal: Soft. Bowel sounds are normal. There is no tenderness. There is no rebound and no guarding.  Musculoskeletal: Normal range of motion. She exhibits no edema or tenderness.  Lymphadenopathy:    She has no cervical adenopathy.  Neurological: She is alert and oriented to person, place, and time.  5/5 motor in all extremities.  Sensation fully intact.  No tremor present.  Skin: Skin is warm and dry. No rash noted. She is not diaphoretic. No erythema.  Psychiatric: She has a normal mood and affect. Her behavior is normal.  Nursing note and vitals reviewed.    ED Treatments / Results  Labs (all labs ordered are listed, but only abnormal results are displayed) Labs Reviewed  RAPID URINE DRUG SCREEN, HOSP PERFORMED - Abnormal; Notable for the following components:      Result Value   Tetrahydrocannabinol POSITIVE (*)    All other components within normal limits  CBC WITH DIFFERENTIAL/PLATELET  COMPREHENSIVE METABOLIC PANEL  I-STAT BETA HCG BLOOD, ED ([facility], [facility], [facility] ONLY)    EKG EKG Interpretation  Date/Time:  Friday September 07 2018 17:52:45 EDT Ventricular Rate:  67 PR Interval:    QRS Duration: 89 QT Interval:  409 QTC Calculation: 432 R Axis:   47 Text Interpretation:  Sinus rhythm Short PR interval Confirmed by Loren Racer ([id number]) on 09/07/2018 6:25:13 PM   Radiology No results found.  Procedures Procedures (including critical care time)  Medications Ordered in ED Medications  ondansetron (ZOFRAN-ODT) disintegrating tablet 4 mg (4 mg Oral Given 09/07/18 1750)     Initial Impression / Assessment and Plan / ED Course  I have reviewed the triage vital  signs and the nursing notes.  Pertinent labs & imaging results that were available during my care of the patient were reviewed by me and considered in my medical decision making (see chart for details).     UDS positive only for THC.  Electrolytes are normal.  Patient is well-appearing.  Stable vital signs.  Advised to avoid vaping.  Final Clinical Impressions(s) / ED Diagnoses   Final diagnoses:  Nausea    ED Discharge Orders         Ordered    ondansetron (ZOFRAN ODT) 4 MG disintegrating tablet  Every 8 hours PRN     09/07/18 1901           Loren Racer, MD 09/07/18 [phone]

## 2018-09-07 NOTE — ED Triage Notes (Signed)
Patient states she used a vape pen last night and has had nausea since using. Patient reported that she felt like something else was in the vape pen and states,"I just feel horrible."

## 2018-09-27 DIAGNOSIS — Z23 Encounter for immunization: Secondary | ICD-10-CM | POA: Diagnosis not present

## 2018-09-27 DIAGNOSIS — H6120 Impacted cerumen, unspecified ear: Secondary | ICD-10-CM | POA: Diagnosis not present

## 2018-10-16 ENCOUNTER — Encounter (HOSPITAL_COMMUNITY): Payer: Self-pay | Admitting: Emergency Medicine

## 2018-10-16 ENCOUNTER — Emergency Department (HOSPITAL_COMMUNITY)
Admission: EM | Admit: 2018-10-16 | Discharge: 2018-10-16 | Disposition: A | Discharge status: Home / Self Care | Payer: BLUE CROSS/BLUE SHIELD | Attending: Emergency Medicine | Admitting: Emergency Medicine

## 2018-10-16 ENCOUNTER — Other Ambulatory Visit: Payer: Self-pay

## 2018-10-16 DIAGNOSIS — F419 Anxiety disorder, unspecified: Secondary | ICD-10-CM | POA: Diagnosis not present

## 2018-10-16 DIAGNOSIS — Z79899 Other long term (current) drug therapy: Secondary | ICD-10-CM | POA: Insufficient documentation

## 2018-10-16 DIAGNOSIS — F319 Bipolar disorder, unspecified: Secondary | ICD-10-CM | POA: Diagnosis not present

## 2018-10-16 DIAGNOSIS — K529 Noninfective gastroenteritis and colitis, unspecified: Secondary | ICD-10-CM | POA: Diagnosis not present

## 2018-10-16 DIAGNOSIS — R1032 Left lower quadrant pain: Secondary | ICD-10-CM | POA: Diagnosis not present

## 2018-10-16 LAB — COMPREHENSIVE METABOLIC PANEL
ALBUMIN: 3.9 g/dL (ref 3.5–5.0)
ALT: 20 U/L (ref 0–44)
AST: 20 U/L (ref 15–41)
Alkaline Phosphatase: 58 U/L (ref 38–126)
Anion gap: 7 (ref 5–15)
BUN: 9 mg/dL (ref 6–20)
CALCIUM: 9.1 mg/dL (ref 8.9–10.3)
CHLORIDE: 107 mmol/L (ref 98–111)
CO2: 24 mmol/L (ref 22–32)
Creatinine, Ser: 0.81 mg/dL (ref 0.44–1.00)
GFR calc Af Amer: >60 mL/min (ref >60)
GFR calc non Af Amer: >60 mL/min (ref >60)
Glucose, Bld: 89 mg/dL (ref 70–99)
Potassium: 3.7 mmol/L (ref 3.5–5.1)
SODIUM: 138 mmol/L (ref 135–145)
Total Bilirubin: 0.7 mg/dL (ref 0.3–1.2)
Total Protein: 6.8 g/dL (ref 6.5–8.1)

## 2018-10-16 LAB — CBC WITH DIFFERENTIAL/PLATELET
Abs Immature Granulocytes: 0.03 10*3/uL (ref 0.00–0.07)
BASOS ABS: 0 10*3/uL (ref 0.0–0.1)
BASOS PCT: 0 %
EOS PCT: 1 %
Eosinophils Absolute: 0.1 10*3/uL (ref 0.0–0.5)
HCT: 39.4 % (ref 36.0–46.0)
HEMOGLOBIN: 12.6 g/dL (ref 12.0–15.0)
Immature Granulocytes: 0 %
LYMPHS PCT: 20 %
Lymphs Abs: 2 10*3/uL (ref 0.7–4.0)
MCH: 29.6 pg (ref 26.0–34.0)
MCHC: 32 g/dL (ref 30.0–36.0)
MCV: 92.5 fL (ref 80.0–100.0)
Monocytes Absolute: 0.5 10*3/uL (ref 0.1–1.0)
Monocytes Relative: 5 %
NRBC: 0 % (ref 0.0–0.2)
Neutro Abs: 7.5 10*3/uL (ref 1.7–7.7)
Neutrophils Relative %: 74 %
PLATELETS: 264 10*3/uL (ref 150–400)
RBC: 4.26 MIL/uL (ref 3.87–5.11)
RDW: 13.6 % (ref 11.5–15.5)
WBC: 10.1 10*3/uL (ref 4.0–10.5)

## 2018-10-16 LAB — URINALYSIS, ROUTINE W REFLEX MICROSCOPIC
BILIRUBIN URINE: NEGATIVE
Glucose, UA: NEGATIVE mg/dL
Hgb urine dipstick: NEGATIVE
KETONES UR: NEGATIVE mg/dL
Leukocytes, UA: NEGATIVE
NITRITE: NEGATIVE
PROTEIN: NEGATIVE mg/dL
SPECIFIC GRAVITY, URINE: 1.014 (ref 1.005–1.030)
pH: 5 (ref 5.0–8.0)

## 2018-10-16 LAB — LIPASE, BLOOD: Lipase: 29 U/L (ref 11–51)

## 2018-10-16 LAB — I-STAT BETA HCG BLOOD, ED (MC, WL, AP ONLY)

## 2018-10-16 MED ORDER — SODIUM CHLORIDE 0.9 % IV BOLUS
1000.0000 mL | Freq: Once | INTRAVENOUS | Status: AC
  Administered 2018-10-16: 1000 mL via INTRAVENOUS

## 2018-10-16 MED ORDER — ONDANSETRON HCL 4 MG/2ML IJ SOLN
4.0000 mg | Freq: Once | INTRAMUSCULAR | Status: AC
  Administered 2018-10-16: 4 mg via INTRAVENOUS
  Filled 2018-10-16: qty 2

## 2018-10-16 MED ORDER — ONDANSETRON 8 MG PO TBDP: 8.0000 mg | ORAL_TABLET | Freq: Three times a day (TID) | ORAL | 0 refills | Status: AC | PRN

## 2018-10-16 MED ORDER — SODIUM CHLORIDE 0.9 % IV SOLN
INTRAVENOUS | Status: DC
  Administered 2018-10-16: 13:00:00 via INTRAVENOUS

## 2018-10-16 NOTE — Discharge Instructions (Addendum)
Drink plenty of fluids, take the medications as needed for nausea, return to the ED for worsening symptoms, fever, pain

## 2018-10-16 NOTE — ED Triage Notes (Signed)
Pt stated she woke up around 0600 this morning and started vomited. diarrhea as well this morning. No blood

## 2018-10-16 NOTE — ED Provider Notes (Signed)
[place] DEPT Provider Note   CSN: [id number] Arrival date & time: 10/16/18  1035     History   Chief Complaint Chief Complaint  Patient presents with  . Abdominal Pain  . Emesis    HPI Jeanette Nicholson is a 27 y.o. female.  HPI Pt started having pain in the left lower abdomen a few days ago.  She noticed her urine was cloudy.  NO back pain.  No fevers but she felt hot.  TOday she started having vomiting and diarrhea. The symptoms were much more severe so she came to the ED.  Her pain has mostly resolved but she still feels like her stomach is churning.  LMP 10/16 Past Medical History:  Diagnosis Date  . Anxiety   . Bulimia   . Depression     Patient Active Problem List   Diagnosis Date Noted  . Abdominal pain 01/20/2014  . ADD (attention deficit disorder) 08/06/2013  . Nephrolithiasis 11/01/2012  . Contraception 10/21/2012  . Lumbar back pain 10/10/2012  . Multiple personality disorder ([place]) 08/13/2012  . Overactive bladder 05/01/2011  . Bipolar disorder ([place]) 05/01/2011    Past Surgical History:  Procedure Laterality Date  . CHOLECYSTECTOMY N/A 05/16/2013   Procedure: LAPAROSCOPIC CHOLECYSTECTOMY ;  Surgeon: Earnstine Regal, MD;  Location: [facility];  Service: General;  Laterality: N/A;     OB History   None      Home Medications    Prior to Admission medications   Medication Sig Start Date End Date Taking? Authorizing Provider  aspirin-acetaminophen-caffeine (EXCEDRIN MIGRAINE) [phone] MG tablet Take 2 tablets by mouth every 6 (six) hours as needed for headache.   Yes [provider]  calcium carbonate (TUMS - DOSED IN MG ELEMENTAL CALCIUM) 500 MG chewable tablet Chew 2 tablets by mouth every 4 (four) hours as needed for indigestion or heartburn.   Yes [provider]  pseudoephedrine (SUDAFED) 30 MG tablet Take 30 mg by mouth every 6 (six) hours as needed for congestion.   Yes [provider]    omeprazole (PRILOSEC) 20 MG capsule Take 1 capsule (20 mg total) by mouth daily. Patient not taking: Reported on 09/07/2018 03/29/15   Street, [place], PA-C  ondansetron (ZOFRAN ODT) 8 MG disintegrating tablet Take 1 tablet (8 mg total) by mouth every 8 (eight) hours as needed for nausea or vomiting. 10/16/18   Dorie Rank, MD  ondansetron (ZOFRAN) 8 MG tablet Take 1 tablet (8 mg total) by mouth every 8 (eight) hours as needed for nausea or vomiting. Patient not taking: Reported on 09/07/2018 03/29/15   Street, [place], [place]  TRI-SPRINTEC 0.18/0.215/0.25 MG-35 MCG tablet TAKE ONE TABLET BY MOUTH EVERY DAY Patient not taking: Reported on 03/29/2015 08/18/13   Marin Olp, MD    Family History Family History  Problem Relation Age of Onset  . Depression Mother   . Mental illness Mother     Social History Social History   Tobacco Use  . Smoking status: Former Smoker    Types: Cigarettes  . Smokeless tobacco: Never Used  Substance Use Topics  . Alcohol use: Yes    Comment: Occasional.  . Drug use: Yes    Types: Marijuana     Allergies   Abilify [aripiprazole]; Morphine and related; and Latex   Review of Systems Review of Systems  All other systems reviewed and are negative.    Physical Exam Updated Vital Signs BP 108/76   Pulse 84   Temp  98.1 F (36.7 C)   Resp 15   Ht 1.651 m (5' 5")   Wt 74.8 kg   LMP 10/01/2018   SpO2 97%   BMI 27.46 kg/m   Physical Exam  Constitutional: She appears well-developed and well-nourished. No distress.  HENT:  Head: Normocephalic and atraumatic.  Right Ear: External ear normal.  Left Ear: External ear normal.  Eyes: Conjunctivae are normal. Right eye exhibits no discharge. Left eye exhibits no discharge. No scleral icterus.  Neck: Neck supple. No tracheal deviation present.  Cardiovascular: Normal rate, regular rhythm and intact distal pulses.  Pulmonary/Chest: Effort normal and breath sounds normal. No stridor. No  respiratory distress. She has no wheezes. She has no rales.  Abdominal: Soft. Bowel sounds are normal. She exhibits no distension. There is no tenderness. There is no rebound and no guarding.  Musculoskeletal: She exhibits no edema or tenderness.  Neurological: She is alert. She has normal strength. No cranial nerve deficit (no facial droop, extraocular movements intact, no slurred speech) or sensory deficit. She exhibits normal muscle tone. She displays no seizure activity. Coordination normal.  Skin: Skin is warm and dry. No rash noted.  Psychiatric: She has a normal mood and affect.  Nursing note and vitals reviewed.    ED Treatments / Results  Labs (all labs ordered are listed, but only abnormal results are displayed) Labs Reviewed  COMPREHENSIVE METABOLIC PANEL  LIPASE, BLOOD  CBC WITH DIFFERENTIAL/PLATELET  URINALYSIS, ROUTINE W REFLEX MICROSCOPIC  I-STAT BETA HCG BLOOD, ED ([facility], [facility], [facility] ONLY)    EKG None  Radiology No results found.  Procedures Procedures (including critical care time)  Medications Ordered in ED Medications  sodium chloride 0.9 % bolus 1,000 mL (0 mLs Intravenous Stopped 10/16/18 1321)    And  0.9 %  sodium chloride infusion ( Intravenous New Bag/Given 10/16/18 1321)  ondansetron (ZOFRAN) injection 4 mg (4 mg Intravenous Given 10/16/18 1202)     Initial Impression / Assessment and Plan / ED Course  I have reviewed the triage vital signs and the nursing notes.  Pertinent labs & imaging results that were available during my care of the patient were reviewed by me and considered in my medical decision making (see chart for details).   CBC, C met, lipase, UA are normal.  Patient symptoms improved with IV fluids and antinausea medications.  .  I doubt appendicitis ,colitis, TOA, pyelonephritis or other emergency medical condition.  Suspect her symptoms are related to a viral gastroenteritis  Final Clinical Impressions(s) / ED Diagnoses   Final  diagnoses:  Gastroenteritis    ED Discharge Orders         Ordered    ondansetron (ZOFRAN ODT) 8 MG disintegrating tablet  Every 8 hours PRN     10/16/18 1435           Dorie Rank, MD 10/16/18 1457

## 2018-10-25 DIAGNOSIS — Z0000 Encounter for general adult medical examination without abnormal findings: Secondary | ICD-10-CM | POA: Diagnosis not present

## 2018-11-01 DIAGNOSIS — Z0000 Encounter for general adult medical examination without abnormal findings: Secondary | ICD-10-CM | POA: Diagnosis not present

## 2018-11-01 DIAGNOSIS — Z124 Encounter for screening for malignant neoplasm of cervix: Secondary | ICD-10-CM | POA: Diagnosis not present

## 2018-11-01 DIAGNOSIS — Z01419 Encounter for gynecological examination (general) (routine) without abnormal findings: Secondary | ICD-10-CM | POA: Diagnosis not present

## 2018-11-01 DIAGNOSIS — Z113 Encounter for screening for infections with a predominantly sexual mode of transmission: Secondary | ICD-10-CM | POA: Diagnosis not present

## 2018-11-01 DIAGNOSIS — R51 Headache: Secondary | ICD-10-CM | POA: Diagnosis not present

## 2018-11-04 ENCOUNTER — Emergency Department (HOSPITAL_BASED_OUTPATIENT_CLINIC_OR_DEPARTMENT_OTHER)
Admission: EM | Admit: 2018-11-04 | Discharge: 2018-11-04 | Disposition: A | Discharge status: Home / Self Care | Payer: BLUE CROSS/BLUE SHIELD | Attending: Emergency Medicine | Admitting: Emergency Medicine

## 2018-11-04 ENCOUNTER — Other Ambulatory Visit: Payer: Self-pay

## 2018-11-04 ENCOUNTER — Encounter (HOSPITAL_BASED_OUTPATIENT_CLINIC_OR_DEPARTMENT_OTHER): Payer: Self-pay | Admitting: Emergency Medicine

## 2018-11-04 DIAGNOSIS — Z87891 Personal history of nicotine dependence: Secondary | ICD-10-CM | POA: Diagnosis not present

## 2018-11-04 DIAGNOSIS — R51 Headache: Secondary | ICD-10-CM | POA: Insufficient documentation

## 2018-11-04 DIAGNOSIS — R519 Headache, unspecified: Secondary | ICD-10-CM

## 2018-11-04 MED ORDER — DIPHENHYDRAMINE HCL 50 MG/ML IJ SOLN
INTRAMUSCULAR | Status: AC
  Filled 2018-11-04: qty 1

## 2018-11-04 MED ORDER — SODIUM CHLORIDE 0.9 % IV BOLUS
1000.0000 mL | Freq: Once | INTRAVENOUS | Status: AC
  Administered 2018-11-04: 1000 mL via INTRAVENOUS

## 2018-11-04 MED ORDER — DIPHENHYDRAMINE HCL 50 MG/ML IJ SOLN
25.0000 mg | Freq: Once | INTRAMUSCULAR | Status: AC
  Administered 2018-11-04: 25 mg via INTRAVENOUS

## 2018-11-04 MED ORDER — KETOROLAC TROMETHAMINE 15 MG/ML IJ SOLN
15.0000 mg | Freq: Once | INTRAMUSCULAR | Status: AC
  Administered 2018-11-04: 15 mg via INTRAVENOUS
  Filled 2018-11-04: qty 1

## 2018-11-04 MED ORDER — METOCLOPRAMIDE HCL 5 MG/ML IJ SOLN
10.0000 mg | Freq: Once | INTRAMUSCULAR | Status: AC
  Administered 2018-11-04: 10 mg via INTRAVENOUS
  Filled 2018-11-04: qty 2

## 2018-11-04 MED ORDER — SODIUM CHLORIDE 0.9 % IV SOLN
25.0000 mg | INTRAVENOUS | Status: DC | PRN
  Filled 2018-11-04: qty 0.5

## 2018-11-04 NOTE — ED Triage Notes (Signed)
Patient states that she has had "chronic Migraines' for the last 3 weeks - the patient states that during the rush at the theater she felt " this electrical shock feeling" going through her head and face and she couldn't stop shaking all over. She states that she started on migraine medications recently - states that she has these feeling with the migraines. Denies any MHA prior to this Three weeks  - the patient states that it could be because she used to smoke weed and then got something not right - the patient states that she started with her headache and then she started to have the "electric shock feeling, feels like I was licking a 9 volt battery"  - patient states that she could not stop her face twitching when she takes it

## 2018-11-04 NOTE — ED Notes (Addendum)
Pt states in addition to head pain, she feels like she has weakness in her left leg. Pt ambulated to room with no signs of weakness. No weakness noted in extremities during assessment. Pt states touch feels the same in face, hands and legs.

## 2018-11-04 NOTE — ED Provider Notes (Signed)
[facility] EMERGENCY DEPARTMENT Provider Note   CSN: [id number] Arrival date & time: 11/04/18  1943     History   Chief Complaint Chief Complaint  Patient presents with  . Headache    HPI Jeanette Nicholson is a 27 y.o. female.  27 y.o female with a PMH of migraines presents to the ED with a chief complaint of headache x 1 week. Patient reports she recently got placed on sumatriptan for these migraines and reports taking this medication before her migraine came on.Patient describes the migraines as dull in character and reports start at the back of her head and radiates to the front of her head. Patient reports working the "rush hour" at the [employer] made her headache worsen. Patient denies any vomiting, loc, photophobia, phonophobia.      Past Medical History:  Diagnosis Date  . Anxiety   . Bulimia   . Depression     Patient Active Problem List   Diagnosis Date Noted  . Abdominal pain 01/20/2014  . ADD (attention deficit disorder) 08/06/2013  . Nephrolithiasis 11/01/2012  . Contraception 10/21/2012  . Lumbar back pain 10/10/2012  . Multiple personality disorder (HCC) 08/13/2012  . Overactive bladder 05/01/2011  . Bipolar disorder (HCC) 05/01/2011    Past Surgical History:  Procedure Laterality Date  . CHOLECYSTECTOMY N/A 05/16/2013   Procedure: LAPAROSCOPIC CHOLECYSTECTOMY ;  Surgeon: Velora Heckler, MD;  Location: [facility];  Service: General;  Laterality: N/A;     OB History   None      Home Medications    Prior to Admission medications   Medication Sig Start Date End Date Taking? Authorizing Provider  aspirin-acetaminophen-caffeine (EXCEDRIN MIGRAINE) [phone] MG tablet Take 2 tablets by mouth every 6 (six) hours as needed for headache.    [provider]  calcium carbonate (TUMS - DOSED IN MG ELEMENTAL CALCIUM) 500 MG chewable tablet Chew 2 tablets by mouth every 4 (four) hours as needed for indigestion or heartburn.    [provider]  omeprazole (PRILOSEC) 20 MG capsule Take 1 capsule (20 mg total) by mouth daily. Patient not taking: Reported on 09/07/2018 03/29/15   Street, [place], PA-C  ondansetron (ZOFRAN ODT) 8 MG disintegrating tablet Take 1 tablet (8 mg total) by mouth every 8 (eight) hours as needed for nausea or vomiting. 10/16/18   Linwood Dibbles, MD  ondansetron (ZOFRAN) 8 MG tablet Take 1 tablet (8 mg total) by mouth every 8 (eight) hours as needed for nausea or vomiting. Patient not taking: Reported on 09/07/2018 03/29/15   Street, [place], PA-C  pseudoephedrine (SUDAFED) 30 MG tablet Take 30 mg by mouth every 6 (six) hours as needed for congestion.    [provider]  SUMAtriptan (IMITREX) 100 MG tablet TK 1/2-1 T PO PRN UP TO BID .AT LEAST 2 HOURS BETWEEN DOSES 11/01/18   [provider]  TRI-SPRINTEC 0.18/0.215/0.25 MG-35 MCG tablet TAKE ONE TABLET BY MOUTH EVERY DAY Patient not taking: Reported on 03/29/2015 08/18/13   Shelva Majestic, MD    Family History Family History  Problem Relation Age of Onset  . Depression Mother   . Mental illness Mother     Social History Social History   Tobacco Use  . Smoking status: Former Smoker    Types: Cigarettes  . Smokeless tobacco: Never Used  Substance Use Topics  . Alcohol use: Yes    Comment: Occasional.  . Drug use: Yes    Types: Marijuana     Allergies  Abilify [aripiprazole]; Morphine and related; and Latex   Review of Systems Review of Systems  Constitutional: Negative for fever.  Respiratory: Negative for shortness of breath.   Cardiovascular: Negative for chest pain.  Gastrointestinal: Negative for abdominal pain, nausea and vomiting.  Genitourinary: Negative for dysuria and flank pain.  Musculoskeletal: Negative for back pain.  Neurological: Positive for headaches.     Physical Exam Updated Vital Signs BP 100/65   Pulse 74   Temp 98.7 F (37.1 C) (Oral)   Resp 18   Ht 5\' 5"  (1.651 m)   Wt 74.8 kg    LMP 10/28/2018   SpO2 98%   BMI 27.44 kg/m   Physical Exam  Constitutional: She is oriented to person, place, and time. She appears well-developed and well-nourished. No distress.  HENT:  Head: Normocephalic and atraumatic.  Mouth/Throat: Oropharynx is clear and moist. No oropharyngeal exudate.  Eyes: Pupils are equal, round, and reactive to light.  Neck: Normal range of motion.  Cardiovascular: Regular rhythm and normal heart sounds.  Pulmonary/Chest: Effort normal and breath sounds normal. No respiratory distress.  Abdominal: Soft. Bowel sounds are normal. She exhibits no distension. There is no tenderness.  Musculoskeletal: She exhibits no tenderness or deformity.       Right lower leg: She exhibits no edema.       Left lower leg: She exhibits no edema.  Neurological: She is alert and oriented to person, place, and time.  Alert, oriented, thought content appropriate. Speech fluent without evidence of aphasia. Able to follow 2 step commands without difficulty.  Cranial Nerves:  II:  Peripheral visual fields grossly normal, pupils, round, reactive to light III,IV, VI: ptosis not present, extra-ocular motions intact bilaterally  V,VII: smile symmetric, facial light touch sensation equal VIII: hearing grossly normal bilaterally  IX,X: midline uvula rise  XI: bilateral shoulder shrug equal and strong XII: midline tongue extension  Motor:  5/5 in upper and lower extremities bilaterally including strong and equal grip strength and dorsiflexion/plantar flexion Sensory: light touch normal in all extremities.  Cerebellar: normal finger-to-nose with bilateral upper extremities, pronator drift negative Gait: normal gait and balance   Skin: Skin is warm and dry.  Psychiatric: She has a normal mood and affect.  Nursing note and vitals reviewed.    ED Treatments / Results  Labs (all labs ordered are listed, but only abnormal results are displayed) Labs Reviewed - No data to  display  EKG None  Radiology No results found.  Procedures Procedures (including critical care time)  Medications Ordered in ED Medications  ketorolac (TORADOL) 15 MG/ML injection 15 mg (15 mg Intravenous Given 11/04/18 2155)  metoCLOPramide (REGLAN) injection 10 mg (10 mg Intravenous Given 11/04/18 2154)  sodium chloride 0.9 % bolus 1,000 mL ( Intravenous Stopped 11/04/18 2252)  diphenhydrAMINE (BENADRYL) injection 25 mg (25 mg Intravenous Given 11/04/18 2203)     Initial Impression / Assessment and Plan / ED Course  I have reviewed the triage vital signs and the nursing notes.  Pertinent labs & imaging results that were available during my care of the patient were reviewed by me and considered in my medical decision making (see chart for details).    Patient with a PMH of migraines presents with a headache which began this past week. She reports being placed on sumatriptan, which she took for pain relieve. She reports working the rush hour worsen her head pain. Patient received migraine cocktail while in the ED.  She reports her pain has significantly  improved after headache cocktail, she has medication for prophylaxis treatment. Denies any trauma, vomiting, or dizziness. Patient requesting a neurology follow up to further evaluate her symptoms.Vitals stable during ED visit, patient stable for discharge.   Final Clinical Impressions(s) / ED Diagnoses   Final diagnoses:  Bad headache    ED Discharge Orders    None       Claude Manges, PA-C 11/04/18 2257    Little, Ambrose Finland, MD 11/06/18 1433

## 2018-11-04 NOTE — Discharge Instructions (Addendum)
I have provided a referral for a neurologist, please schedule an appointment at your earliest convenience.

## 2018-11-05 ENCOUNTER — Encounter: Payer: Self-pay | Admitting: Neurology

## 2018-11-05 NOTE — Progress Notes (Signed)
NEUROLOGY CONSULTATION NOTE  Jeanette Nicholson MRN: [id number] DOB: 12-17-1991  Referring provider: Frederick Peers, MD (ED referral) Primary care provider: no PCP  Reason for consult:  headache  HISTORY OF PRESENT ILLNESS: Jeanette Nicholson is a 27 year old female with depression and anxiety who presents for headache.  History supplemented by referring provider's note.  Onset:  She used to smoke pot.  On 10/05/18, she smoked tainted pot (not sure what) and then developed headaches.  Then the next day, she developed electric shocks in various areas of her head that then progressed to severe migraines. Location:  Varies (back of head, temples or front bilaterally, sometimes unilateral) Quality:  Varies (pounding, sharp/stabbing, dull) Intensity:  Severe.  These are new headaches.   Aura:  No Prodrome:  Feels overwhelmed/stressed, nauseous, fogginess Postdrome:  No Associated symptoms:  Nausea, photophobia, phonophobia, auditory hallucinations (hears singing), sometimes sees dots.  She denies associated unilateral numbness or weakness. Duration:  2 hours (longest lasted 12 hours, did not have a second pill on her and had to wait several hours) Frequency:  Almost daily Frequency of abortive medication: initially ibuprofen daily until last week and now has taken sumatriptan almost daily over past week. Triggers:  Emotional stress Relieving factors:  Sumatriptan sometimes works Activity:  aggravates  She presented to the ED for her headache two days ago where she was treated with a headache cocktail of Toradol 15mg /Reglan 10mg /Benadryl 25mg .  Current NSAIDS:  none Current analgesics:  Excedrin Migraine Current triptans:  Sumatriptan 50mg  (effective 50% of time, notes heavy sensation) Current ergotamine: None Current anti-emetic:  Zofran ODT 8mg  (not routine) Current muscle relaxants: None Current anti-anxiolytic: None Current sleep aide: None Current Antihypertensive medications:  None Current Antidepressant medications:  None Current Anticonvulsant medications: None Current anti-CGRP: None Current Vitamins/Herbal/Supplements: None Current Antihistamines/Decongestants:  Sudafed (not routine) Other therapy: None Hormone/birth control:  Tri-Sprintec  Past NSAIDS:  Ibuprofen/Advil Past analgesics: None Past abortive triptans: None Past abortive ergotamine: None Past muscle relaxants: None Past anti-emetic: None Past antihypertensive medications: None Past antidepressant medications: None for headaches Past anticonvulsant medications: None for headaches Past anti-CGRP: None Past vitamins/Herbal/Supplements: None Past antihistamines/decongestants: None Other past therapies:  5 hour energy drink (once helped relieve symptoms)  Caffeine:  No Diet: drinks at least 32 oz water daily.  Rarely Sprite. Exercise:  No Depression:  yes; Anxiety:  yes Other pain:  no Sleep hygiene:  good Family history of headache:  Mom (prior history of migraines)  She had an MRI of the brain with and without contrast on 06/19/12 to evaluate left sided facial, upper and lower extremity weakness and occasional syncope, which was personally reviewed and was unremarkable.  CBC and CMP from 10/16/18 were unremarkable.  PAST MEDICAL HISTORY: Past Medical History:  Diagnosis Date  . Anxiety   . Bulimia   . Depression     PAST SURGICAL HISTORY: Past Surgical History:  Procedure Laterality Date  . CHOLECYSTECTOMY N/A 05/16/2013   Procedure: LAPAROSCOPIC CHOLECYSTECTOMY ;  Surgeon: Velora Heckler, MD;  Location: [facility];  Service: General;  Laterality: N/A;    MEDICATIONS: Current Outpatient Medications on File Prior to Visit  Medication Sig Dispense Refill  . aspirin-acetaminophen-caffeine (EXCEDRIN MIGRAINE) 250-250-65 MG tablet Take 2 tablets by mouth every 6 (six) hours as needed for headache.    . calcium carbonate (TUMS - DOSED IN MG ELEMENTAL CALCIUM) 500 MG chewable tablet  Chew 2 tablets by mouth every 4 (four) hours as needed for indigestion  or heartburn.    Marland Kitchen omeprazole (PRILOSEC) 20 MG capsule Take 1 capsule (20 mg total) by mouth daily. (Patient not taking: Reported on 09/07/2018) 30 capsule 0  . ondansetron (ZOFRAN ODT) 8 MG disintegrating tablet Take 1 tablet (8 mg total) by mouth every 8 (eight) hours as needed for nausea or vomiting. 12 tablet 0  . ondansetron (ZOFRAN) 8 MG tablet Take 1 tablet (8 mg total) by mouth every 8 (eight) hours as needed for nausea or vomiting. (Patient not taking: Reported on 09/07/2018) 10 tablet 0  . pseudoephedrine (SUDAFED) 30 MG tablet Take 30 mg by mouth every 6 (six) hours as needed for congestion.    . SUMAtriptan (IMITREX) 100 MG tablet TK 1/2-1 T PO PRN UP TO BID .AT LEAST 2 HOURS BETWEEN DOSES  1  . TRI-SPRINTEC 0.18/0.215/0.25 MG-35 MCG tablet TAKE ONE TABLET BY MOUTH EVERY DAY (Patient not taking: Reported on 03/29/2015) 1 Package 11   No current facility-administered medications on file prior to visit.     ALLERGIES: Allergies  Allergen Reactions  . Abilify [Aripiprazole] Other (See Comments)    Body numbness and tremors and shaking  . Morphine And Related   . Latex Itching and Rash    FAMILY HISTORY: Family History  Problem Relation Age of Onset  . Depression Mother   . Mental illness Mother    SOCIAL HISTORY: Social History   Socioeconomic History  . Marital status: Significant Other    Spouse name: Not on file  . Number of children: Not on file  . Years of education: Not on file  . Highest education level: Not on file  Occupational History  . Not on file  Social Needs  . Financial resource strain: Not on file  . Food insecurity:    Worry: Not on file    Inability: Not on file  . Transportation needs:    Medical: Not on file    Non-medical: Not on file  Tobacco Use  . Smoking status: Former Smoker    Types: Cigarettes  . Smokeless tobacco: Never Used  Substance and Sexual Activity  .  Alcohol use: Yes    Comment: Occasional.  . Drug use: Yes    Types: Marijuana  . Sexual activity: Never    Birth control/protection: Abstinence  Lifestyle  . Physical activity:    Days per week: Not on file    Minutes per session: Not on file  . Stress: Not on file  Relationships  . Social connections:    Talks on phone: Not on file    Gets together: Not on file    Attends religious service: Not on file    Active member of club or organization: Not on file    Attends meetings of clubs or organizations: Not on file    Relationship status: Not on file  . Intimate partner violence:    Fear of current or ex partner: Not on file    Emotionally abused: Not on file    Physically abused: Not on file    Forced sexual activity: Not on file  Other Topics Concern  . Not on file  Social History Narrative   Working at [employer]  general.  Plans to attend [employer] in fall    REVIEW OF SYSTEMS: Constitutional: No fevers, chills, or sweats, no generalized fatigue, change in appetite Eyes: No visual changes, double vision, eye pain Ear, nose and throat: No hearing loss, ear pain, nasal congestion, sore throat Cardiovascular: No chest pain, palpitations Respiratory:  No shortness of breath at rest or with exertion, wheezes GastrointestinaI: No nausea, vomiting, diarrhea, abdominal pain, fecal incontinence Genitourinary:  No dysuria, urinary retention or frequency Musculoskeletal:  No neck pain, back pain Integumentary: No rash, pruritus, skin lesions Neurological: as above Psychiatric: No depression, insomnia, anxiety Endocrine: No palpitations, fatigue, diaphoresis, mood swings, change in appetite, change in weight, increased thirst Hematologic/Lymphatic:  No purpura, petechiae. Allergic/Immunologic: no itchy/runny eyes, nasal congestion, recent allergic reactions, rashes  PHYSICAL EXAM: Blood pressure 110/84, pulse 82, height 5\' 5"  (1.651 m), weight 180 lb 2 oz (81.7 kg), last menstrual period  10/28/2018, SpO2 98 %. General: No acute distress.  Patient appears well-groomed.  Head:  Normocephalic/atraumatic Eyes:  fundi examined but not visualized Neck: supple, no paraspinal tenderness, full range of motion Back: No paraspinal tenderness Heart: regular rate and rhythm Lungs: Clear to auscultation bilaterally. Vascular: No carotid bruits. Neurological Exam: Mental status: alert and oriented to person, place, and time, recent and remote memory intact, fund of knowledge intact, attention and concentration intact, speech fluent and not dysarthric, language intact. Cranial nerves: CN I: not tested CN II: pupils equal, round and reactive to light, visual fields intact CN III, IV, VI:  full range of motion, no nystagmus, no ptosis CN V: facial sensation intact CN VII: upper and lower face symmetric CN VIII: hearing intact CN IX, X: gag intact, uvula midline CN XI: sternocleidomastoid and trapezius muscles intact CN XII: tongue midline Bulk & Tone: normal, no fasciculations. Motor:  5/5 throughout  Sensation:  temperature and vibration sensation intact. Deep Tendon Reflexes:  2+ throughout, toes downgoing.  Finger to nose testing:  Without dysmetria.  Heel to shin:  Without dysmetria.  Gait:  Normal station and stride.  Able to turn and tandem walk. Romberg negative.  IMPRESSION: New onset daily headaches, likely migraine without aura, without status migrainosus, not intractable.    PLAN: 1.  Start topiramate 50 mg at bedtime.  Side effects discussed.  Advised to take precautions not to get pregnant while on the medication.  If headaches not improved in 4 weeks, we can increase dose to 100 mg at bedtime. 2.  For abortive therapy, she will take sumatriptan 50 mg at earliest onset.  She may repeat the dose once with naproxen 500 mg if needed.  She may continue Zofran as needed for nausea. 3.  Limit use of pain relievers to no more than 2 days out of week to prevent risk of rebound  or medication-overuse headache. 4.  Keep headache diary 5.  As these are new onset severe daily headaches, we will check MRI of the brain without contrast to evaluate for secondary intracranial etiology. 6.  Follow-up in 3 to 4 months.  Thank you for allowing me to take part in the care of this patient.  Shon MilletAdam Jaffe, DO

## 2018-11-06 ENCOUNTER — Encounter: Payer: Self-pay | Admitting: Neurology

## 2018-11-06 ENCOUNTER — Ambulatory Visit (INDEPENDENT_AMBULATORY_CARE_PROVIDER_SITE_OTHER): Payer: BLUE CROSS/BLUE SHIELD | Admitting: Neurology

## 2018-11-06 VITALS — BP 110/84 | HR 82 | Ht 65.0 in | Wt 180.1 lb

## 2018-11-06 DIAGNOSIS — G43009 Migraine without aura, not intractable, without status migrainosus: Secondary | ICD-10-CM | POA: Diagnosis not present

## 2018-11-06 DIAGNOSIS — G4452 New daily persistent headache (NDPH): Secondary | ICD-10-CM | POA: Diagnosis not present

## 2018-11-06 MED ORDER — TOPIRAMATE 50 MG PO TABS: 50.0000 mg | ORAL_TABLET | Freq: Every day | ORAL | 3 refills | Status: AC

## 2018-11-06 MED ORDER — NAPROXEN 500 MG PO TABS: 500.0000 mg | ORAL_TABLET | Freq: Two times a day (BID) | ORAL | 3 refills | Status: DC | PRN

## 2018-11-06 NOTE — Patient Instructions (Addendum)
Migraine Recommendations: 1.  Start topiramate 50mg  at bedtime.  Contact me in 4 weeks with update and we can adjust dose if needed. 2.  Take sumatriptan 50mg  at earliest onset of headache.  May repeat dose once in 2 hours with naproxen 500mg  if needed.  Do not exceed two doses in 24 hours.  Zofran for nausea if needed. 3.  Limit use of pain relievers to no more than 2 days out of the week.  These medications include acetaminophen, ibuprofen, triptans and narcotics.  This will help reduce risk of rebound headaches. 4.  Be aware of common food triggers such as processed sweets, processed foods with nitrites (such as deli meat, hot dogs, sausages), foods with MSG, alcohol (such as wine), chocolate, certain cheeses, certain fruits (dried fruits, bananas, pineapple), vinegar, diet soda. 4.  Avoid caffeine 5.  Routine exercise 6.  Proper sleep hygiene 7.  Stay adequately hydrated with water 8.  Keep a headache diary. 9.  Maintain proper stress management. 10.  Do not skip meals. 11.  Consider supplements:  Magnesium citrate 400mg  to 600mg  daily, riboflavin 400mg , Coenzyme Q 10 100mg  three times daily 12.  Since these are new daily headaches, will get MRI of brain 13.  Follow up in 3 to 4 months.  [facility] and they will call you directly to schedule your appt. They are located at [address] If you need to contact them directly please call [phone].

## 2018-11-13 ENCOUNTER — Other Ambulatory Visit: Payer: BLUE CROSS/BLUE SHIELD

## 2018-11-22 ENCOUNTER — Ambulatory Visit
Admission: RE | Admit: 2018-11-22 | Discharge: 2018-11-22 | Disposition: A | Discharge status: Home / Self Care | Payer: BLUE CROSS/BLUE SHIELD | Source: Ambulatory Visit | Attending: Neurology | Admitting: Neurology

## 2018-11-22 DIAGNOSIS — G4452 New daily persistent headache (NDPH): Secondary | ICD-10-CM

## 2018-11-22 DIAGNOSIS — R51 Headache: Secondary | ICD-10-CM | POA: Diagnosis not present

## 2018-11-23 ENCOUNTER — Telehealth: Payer: Self-pay | Admitting: *Deleted

## 2018-11-23 ENCOUNTER — Encounter: Payer: Self-pay | Admitting: *Deleted

## 2018-11-23 NOTE — Telephone Encounter (Signed)
Results sent via My Chart.  

## 2018-11-23 NOTE — Telephone Encounter (Signed)
MRI of brain is normal. It reveals nothing concerning or anything else that would be contributing to headaches.

## 2018-11-23 NOTE — Telephone Encounter (Signed)
-----   Message from Adam R Jaffe, DO sent at 11/23/2018  6:54 AM EST ----- MRI of brain is normal.  It reveals nothing concerning or anything else that would be contributing to headaches. 

## 2018-11-23 NOTE — Telephone Encounter (Signed)
-----   Message from Drema DallasAdam R Jaffe, DO sent at 11/23/2018  6:54 AM EST ----- MRI of brain is normal.  It reveals nothing concerning or anything else that would be contributing to headaches.

## 2018-11-25 ENCOUNTER — Emergency Department (HOSPITAL_BASED_OUTPATIENT_CLINIC_OR_DEPARTMENT_OTHER)
Admission: EM | Admit: 2018-11-25 | Discharge: 2018-11-25 | Disposition: A | Discharge status: Home / Self Care | Payer: BLUE CROSS/BLUE SHIELD | Attending: Emergency Medicine | Admitting: Emergency Medicine

## 2018-11-25 ENCOUNTER — Encounter (HOSPITAL_BASED_OUTPATIENT_CLINIC_OR_DEPARTMENT_OTHER): Payer: Self-pay | Admitting: Emergency Medicine

## 2018-11-25 ENCOUNTER — Other Ambulatory Visit: Payer: Self-pay

## 2018-11-25 DIAGNOSIS — R202 Paresthesia of skin: Secondary | ICD-10-CM | POA: Insufficient documentation

## 2018-11-25 DIAGNOSIS — Z91040 Latex allergy status: Secondary | ICD-10-CM | POA: Insufficient documentation

## 2018-11-25 DIAGNOSIS — Z87891 Personal history of nicotine dependence: Secondary | ICD-10-CM | POA: Diagnosis not present

## 2018-11-25 DIAGNOSIS — Z79899 Other long term (current) drug therapy: Secondary | ICD-10-CM | POA: Diagnosis not present

## 2018-11-25 DIAGNOSIS — R200 Anesthesia of skin: Secondary | ICD-10-CM | POA: Diagnosis not present

## 2018-11-25 LAB — BASIC METABOLIC PANEL
Anion gap: 4 — ABNORMAL LOW (ref 5–15)
BUN: 15 mg/dL (ref 6–20)
CO2: 24 mmol/L (ref 22–32)
Calcium: 8.6 mg/dL — ABNORMAL LOW (ref 8.9–10.3)
Chloride: 111 mmol/L (ref 98–111)
Creatinine, Ser: 0.66 mg/dL (ref 0.44–1.00)
GFR calc Af Amer: >60 mL/min (ref >60)
GFR calc non Af Amer: >60 mL/min (ref >60)
Glucose, Bld: 103 mg/dL — ABNORMAL HIGH (ref 70–99)
Potassium: 3.9 mmol/L (ref 3.5–5.1)
Sodium: 139 mmol/L (ref 135–145)

## 2018-11-25 LAB — CBC WITH DIFFERENTIAL/PLATELET
Abs Immature Granulocytes: 0.02 10*3/uL (ref 0.00–0.07)
Basophils Absolute: 0.1 10*3/uL (ref 0.0–0.1)
Basophils Relative: 1 %
Eosinophils Absolute: 0.5 10*3/uL (ref 0.0–0.5)
Eosinophils Relative: 7 %
HCT: 40.6 % (ref 36.0–46.0)
Hemoglobin: 12.7 g/dL (ref 12.0–15.0)
Immature Granulocytes: 0 %
Lymphocytes Relative: 29 %
Lymphs Abs: 2.2 10*3/uL (ref 0.7–4.0)
MCH: 29.5 pg (ref 26.0–34.0)
MCHC: 31.3 g/dL (ref 30.0–36.0)
MCV: 94.2 fL (ref 80.0–100.0)
Monocytes Absolute: 0.9 10*3/uL (ref 0.1–1.0)
Monocytes Relative: 12 %
Neutro Abs: 3.7 10*3/uL (ref 1.7–7.7)
Neutrophils Relative %: 51 %
Platelets: 231 10*3/uL (ref 150–400)
RBC: 4.31 MIL/uL (ref 3.87–5.11)
RDW: 13 % (ref 11.5–15.5)
WBC: 7.4 10*3/uL (ref 4.0–10.5)
nRBC: 0 % (ref 0.0–0.2)

## 2018-11-25 MED ORDER — SODIUM CHLORIDE 0.9 % IV BOLUS
1000.0000 mL | Freq: Once | INTRAVENOUS | Status: AC
  Administered 2018-11-25: 1000 mL via INTRAVENOUS

## 2018-11-25 MED ORDER — KETOROLAC TROMETHAMINE 30 MG/ML IJ SOLN
30.0000 mg | Freq: Once | INTRAMUSCULAR | Status: AC
  Administered 2018-11-25: 30 mg via INTRAVENOUS
  Filled 2018-11-25: qty 1

## 2018-11-25 MED ORDER — METHYLPREDNISOLONE SODIUM SUCC 125 MG IJ SOLR
80.0000 mg | Freq: Once | INTRAMUSCULAR | Status: AC
  Administered 2018-11-25: 80 mg via INTRAVENOUS
  Filled 2018-11-25: qty 2

## 2018-11-25 MED ORDER — FAMOTIDINE IN NACL 20-0.9 MG/50ML-% IV SOLN
20.0000 mg | Freq: Once | INTRAVENOUS | Status: AC
  Administered 2018-11-25: 20 mg via INTRAVENOUS
  Filled 2018-11-25: qty 50

## 2018-11-25 MED ORDER — DIPHENHYDRAMINE HCL 50 MG/ML IJ SOLN
50.0000 mg | Freq: Once | INTRAMUSCULAR | Status: AC
  Administered 2018-11-25: 50 mg via INTRAVENOUS
  Filled 2018-11-25: qty 1

## 2018-11-25 MED ORDER — METOCLOPRAMIDE HCL 5 MG/ML IJ SOLN
10.0000 mg | Freq: Once | INTRAMUSCULAR | Status: AC
  Administered 2018-11-25: 10 mg via INTRAVENOUS
  Filled 2018-11-25: qty 2

## 2018-11-25 NOTE — ED Notes (Signed)
ED Provider at bedside. 

## 2018-11-25 NOTE — ED Triage Notes (Addendum)
Pt states she took a new birth control pill at 10:40. She reports at 11:00 she got a sharp pain in her chest with subsequent facial numbness and tongue numbness which persists and tingling to L hand. Denies SOB, difficulty swallowing.

## 2018-11-25 NOTE — ED Notes (Signed)
Up to BR, gait steady, states," I feel so much better" Family member at bedside

## 2018-11-25 NOTE — ED Notes (Signed)
Generalized numbness voiced.  Medication administered as ordered. Will continue to monitor.

## 2018-11-25 NOTE — Discharge Instructions (Signed)
Stop taking the birth control for now.  Please talk to the prescriber before taking it again.  Please follow-up with your doctor for recheck and further evaluation.  Please return the emergency department if develop new or worsening symptoms.

## 2018-11-26 NOTE — ED Provider Notes (Signed)
[facility] EMERGENCY DEPARTMENT Provider Note   CSN: [id number] Arrival date & time: 11/25/18  1207     History   Chief Complaint Chief Complaint  Patient presents with  . Numbness  . Medication Reaction    HPI Jeanette Nicholson is a 27 y.o. female with history of anxiety, depression, bulimia, migraines who presents with sudden onset bilateral facial numbness and bilateral hand tingling.  Patient reports she took a new birth control pill around 1140 and 20 minutes later, patient was driving in her car to work and she felt a pain in her chest and suddenly felt her face and hands going numb.  She also reports her tongue feeling numb.  Patient reports her hands are starting to get better, however her face is still numb bilaterally.  Patient reports her chest pain is resolved.  She denies any shortness of breath, swelling of her lips, tongue, throat, difficulty swallowing, abdominal pain, nausea, vomiting.  Patient reports having a reaction to Abilify in the past that felt similar, however today's episode feels worse.  Patient's had the MRI 3 days ago to assess her migraine headaches and it was normal.  No other medications taken prior to arrival other than the new birth control.  Patient also reports she has had cold symptoms for the past 2 days.  HPI  Past Medical History:  Diagnosis Date  . Anxiety   . Bulimia   . Depression     Patient Active Problem List   Diagnosis Date Noted  . Abdominal pain 01/20/2014  . ADD (attention deficit disorder) 08/06/2013  . Nephrolithiasis 11/01/2012  . Contraception 10/21/2012  . Lumbar back pain 10/10/2012  . Multiple personality disorder (HCC) 08/13/2012  . Overactive bladder 05/01/2011  . Bipolar disorder (HCC) 05/01/2011    Past Surgical History:  Procedure Laterality Date  . CHOLECYSTECTOMY N/A 05/16/2013   Procedure: LAPAROSCOPIC CHOLECYSTECTOMY ;  Surgeon: Velora Heckler, MD;  Location: [facility];  Service: General;   Laterality: N/A;     OB History   None      Home Medications    Prior to Admission medications   Medication Sig Start Date End Date Taking? Authorizing Provider  aspirin-acetaminophen-caffeine (EXCEDRIN MIGRAINE) [phone] MG tablet Take 2 tablets by mouth every 6 (six) hours as needed for headache.    [provider]  calcium carbonate (TUMS - DOSED IN MG ELEMENTAL CALCIUM) 500 MG chewable tablet Chew 2 tablets by mouth every 4 (four) hours as needed for indigestion or heartburn.    [provider]  naproxen (NAPROSYN) 500 MG tablet Take 1 tablet (500 mg total) by mouth every 12 (twelve) hours as needed. 11/06/18   Drema Dallas, DO  omeprazole (PRILOSEC) 20 MG capsule Take 1 capsule (20 mg total) by mouth daily. 03/29/15   Street, Mercedes, PA-C  ondansetron (ZOFRAN ODT) 8 MG disintegrating tablet Take 1 tablet (8 mg total) by mouth every 8 (eight) hours as needed for nausea or vomiting. 10/16/18   Linwood Dibbles, MD  pseudoephedrine (SUDAFED) 30 MG tablet Take 30 mg by mouth every 6 (six) hours as needed for congestion.    [provider]  SUMAtriptan (IMITREX) 100 MG tablet TK 1/2-1 T PO PRN UP TO BID .AT LEAST 2 HOURS BETWEEN DOSES 11/01/18   [provider]  topiramate (TOPAMAX) 50 MG tablet Take 1 tablet (50 mg total) by mouth at bedtime. 11/06/18   Everlena Cooper, Adam R, DO  TRI-SPRINTEC 0.18/0.215/0.25 MG-35 MCG tablet TAKE ONE  TABLET BY MOUTH EVERY DAY 08/18/13   Shelva MajesticHunter, Stephen O, MD    Family History Family History  Problem Relation Age of Onset  . Depression Mother   . Mental illness Mother     Social History Social History   Tobacco Use  . Smoking status: Former Smoker    Types: Cigarettes  . Smokeless tobacco: Never Used  Substance Use Topics  . Alcohol use: Yes    Comment: Occasional.  . Drug use: Yes    Types: Marijuana     Allergies   Abilify [aripiprazole]; Morphine and related; and Latex   Review of Systems Review of  Systems  Constitutional: Negative for chills and fever.  HENT: Negative for facial swelling and sore throat.   Respiratory: Negative for shortness of breath.   Cardiovascular: Positive for chest pain (resolved).  Gastrointestinal: Negative for abdominal pain, nausea and vomiting.  Genitourinary: Negative for dysuria.  Musculoskeletal: Negative for back pain.  Skin: Negative for rash and wound.  Neurological: Positive for numbness. Negative for headaches.  Psychiatric/Behavioral: The patient is not nervous/anxious.      Physical Exam Updated Vital Signs BP 111/68   Pulse 81   Temp 98.8 F (37.1 C) (Oral)   Resp 17   Ht 5\' 5"  (1.651 m)   Wt 79.4 kg   LMP 10/28/2018   SpO2 100%   BMI 29.12 kg/m   Physical Exam  Constitutional: She appears well-developed and well-nourished. No distress.  HENT:  Head: Normocephalic and atraumatic.  Mouth/Throat: Oropharynx is clear and moist. No oropharyngeal exudate.  No angioedema noted to the lips, tongue, posterior oropharynx  Eyes: Pupils are equal, round, and reactive to light. Conjunctivae are normal. Right eye exhibits no discharge. Left eye exhibits no discharge. No scleral icterus.  Neck: Normal range of motion. Neck supple. No thyromegaly present.  Cardiovascular: Normal rate, regular rhythm, normal heart sounds and intact distal pulses. Exam reveals no gallop and no friction rub.  No murmur heard. Pulmonary/Chest: Effort normal and breath sounds normal. No stridor. No respiratory distress. She has no wheezes. She has no rales.  Abdominal: Soft. Bowel sounds are normal. She exhibits no distension. There is no tenderness. There is no rebound and no guarding.  Musculoskeletal: She exhibits no edema.  Lymphadenopathy:    She has no cervical adenopathy.  Neurological: She is alert. Coordination normal.  CN 3-12 intact; normal sensation throughout except for decreased on the face and hands bilaterally; 5/5 strength in all 4 extremities;  equal bilateral grip strength   Skin: Skin is warm and dry. No rash noted. She is not diaphoretic. No pallor.  Psychiatric: She has a normal mood and affect.  Nursing note and vitals reviewed.    ED Treatments / Results  Labs (all labs ordered are listed, but only abnormal results are displayed) Labs Reviewed  BASIC METABOLIC PANEL - Abnormal; Notable for the following components:      Result Value   Glucose, Bld 103 (*)    Calcium 8.6 (*)    Anion gap 4 (*)    All other components within normal limits  CBC WITH DIFFERENTIAL/PLATELET    EKG EKG Interpretation  Date/Time:  Sunday November 25 2018 12:49:00 EST Ventricular Rate:  70 PR Interval:    QRS Duration: 75 QT Interval:  409 QTC Calculation: 442 R Axis:   69 Text Interpretation:  Sinus arrhythmia Short PR interval When compared to priorm no significant changes seen.  No STEMI Confirmed by Tegeler, Thayer Ohmhris ([id number]) on  11/25/2018 1:03:53 PM   Radiology No results found.  Procedures Procedures (including critical care time)  Medications Ordered in ED Medications  diphenhydrAMINE (BENADRYL) injection 50 mg (50 mg Intravenous Given 11/25/18 1304)  sodium chloride 0.9 % bolus 1,000 mL (0 mLs Intravenous Stopped 11/25/18 1436)  famotidine (PEPCID) IVPB 20 mg premix (0 mg Intravenous Stopped 11/25/18 1437)  methylPREDNISolone sodium succinate (SOLU-MEDROL) 125 mg/2 mL injection 80 mg (80 mg Intravenous Given 11/25/18 1302)  ketorolac (TORADOL) 30 MG/ML injection 30 mg (30 mg Intravenous Given 11/25/18 1319)  metoCLOPramide (REGLAN) injection 10 mg (10 mg Intravenous Given 11/25/18 1319)     Initial Impression / Assessment and Plan / ED Course  I have reviewed the triage vital signs and the nursing notes.  Pertinent labs & imaging results that were available during my care of the patient were reviewed by me and considered in my medical decision making (see chart for details).     Patient presenting with abnormal  neurological symptoms soon after having a new birth control pill.  Labs are unremarkable.  I discussed patient case with neurologist on-call Dr. Wilford Corner who advised treating with a headache cocktail and is is allergic reaction.  He advised since patient had MRI 3 days ago that was normal, no further imaging is necessary.  Patient is feeling back to baseline after Toradol, Reglan, Solu-Medrol, Pepcid, Benadryl.  Patient advised to follow-up with your doctor.  Will stop the birth control medication for now, although patient's presentation would be an abnormal allergic reaction presentation.  Strict return precautions given.  Patient understands and agrees with plan.  Patient vitals stable throughout ED course and discharged in satisfactory condition.  Final Clinical Impressions(s) / ED Diagnoses   Final diagnoses:  Paresthesia    ED Discharge Orders    None       Emi Holes, PA-C 11/26/18 1022    Tegeler, Canary Brim, MD 11/26/18 [phone]

## 2019-01-15 ENCOUNTER — Ambulatory Visit: Payer: BLUE CROSS/BLUE SHIELD | Admitting: Neurology

## 2019-03-10 ENCOUNTER — Encounter (HOSPITAL_BASED_OUTPATIENT_CLINIC_OR_DEPARTMENT_OTHER): Payer: Self-pay | Admitting: Emergency Medicine

## 2019-03-10 ENCOUNTER — Other Ambulatory Visit: Payer: Self-pay

## 2019-03-10 ENCOUNTER — Emergency Department (HOSPITAL_BASED_OUTPATIENT_CLINIC_OR_DEPARTMENT_OTHER)
Admission: EM | Admit: 2019-03-10 | Discharge: 2019-03-10 | Disposition: A | Discharge status: Home / Self Care | Payer: Managed Care, Other (non HMO) | Attending: Emergency Medicine | Admitting: Emergency Medicine

## 2019-03-10 ENCOUNTER — Emergency Department (HOSPITAL_BASED_OUTPATIENT_CLINIC_OR_DEPARTMENT_OTHER): Payer: Managed Care, Other (non HMO)

## 2019-03-10 DIAGNOSIS — Z87891 Personal history of nicotine dependence: Secondary | ICD-10-CM | POA: Insufficient documentation

## 2019-03-10 DIAGNOSIS — J029 Acute pharyngitis, unspecified: Secondary | ICD-10-CM | POA: Diagnosis not present

## 2019-03-10 DIAGNOSIS — Z91040 Latex allergy status: Secondary | ICD-10-CM | POA: Insufficient documentation

## 2019-03-10 DIAGNOSIS — J069 Acute upper respiratory infection, unspecified: Secondary | ICD-10-CM | POA: Diagnosis not present

## 2019-03-10 DIAGNOSIS — R05 Cough: Secondary | ICD-10-CM | POA: Diagnosis present

## 2019-03-10 LAB — PREGNANCY, URINE: PREG TEST UR: NEGATIVE

## 2019-03-10 LAB — GROUP A STREP BY PCR: Group A Strep by PCR: NOT DETECTED

## 2019-03-10 MED ORDER — ALBUTEROL SULFATE HFA 108 (90 BASE) MCG/ACT IN AERS: 1.0000 | INHALATION_SPRAY | Freq: Four times a day (QID) | RESPIRATORY_TRACT | 0 refills | Status: AC | PRN

## 2019-03-10 MED ORDER — BENZONATATE 100 MG PO CAPS: 100.0000 mg | ORAL_CAPSULE | Freq: Three times a day (TID) | ORAL | 0 refills | Status: AC | PRN

## 2019-03-10 NOTE — ED Triage Notes (Signed)
Pt states that 3 days ago she had a sore throat. This has abated. Around 1700 she started experiencing a cough with chest burning. Sneezing and diarrhea today. Denies sick contacts

## 2019-03-10 NOTE — Discharge Instructions (Signed)
We believe your persistent cough is a result of a viral syndrome for which your symptoms have still not quite resolved.  Sometimes it takes many weeks to completely go away, especially if you smoke or have chronic lung problems.  Please take any medications prescribed and follow up as recommended with your regular doctor.  Stay in isolation at home for the next week. Especially avoid the elderly and immunocompromised.   If you develop any new or worsening symptoms, including but not limited to fever, persistent vomiting, worsening shortness of breath, or other symptoms that concern you, please return to the Emergency Department immediately.

## 2019-03-10 NOTE — ED Provider Notes (Signed)
Emergency Department Provider Note   I have reviewed the triage vital signs and the nursing notes.   HISTORY  Chief Complaint Cough   HPI Jeanette Nicholson is a 28 y.o. female with PMH of ADD, Bipolar Disorder, and Anxiety presents to the emergency department for evaluation of sore throat, cough, congestion.  Patient had severe sore throat earlier in the week but that seemed to have improved somewhat.  She continues to have mild to moderate sore throat but has developed cough, congestion, and sneezing.  She has had some nonbloody diarrhea.  Denies abdominal pain.  No UTI symptoms.  No sick contacts.  She recently started work at [employer]. No SOB, fever, known COVID contact, or travel.   Past Medical History:  Diagnosis Date  . Anxiety   . Bulimia   . Depression     Patient Active Problem List   Diagnosis Date Noted  . Abdominal pain 01/20/2014  . ADD (attention deficit disorder) 08/06/2013  . Nephrolithiasis 11/01/2012  . Contraception 10/21/2012  . Lumbar back pain 10/10/2012  . Multiple personality disorder (HCC) 08/13/2012  . Overactive bladder 05/01/2011  . Bipolar disorder (HCC) 05/01/2011    Past Surgical History:  Procedure Laterality Date  . CHOLECYSTECTOMY N/A 05/16/2013   Procedure: LAPAROSCOPIC CHOLECYSTECTOMY ;  Surgeon: Velora Heckler, MD;  Location: [facility];  Service: General;  Laterality: N/A;    Allergies Abilify [aripiprazole]; Morphine and related; and Latex  Family History  Problem Relation Age of Onset  . Depression Mother   . Mental illness Mother     Social History Social History   Tobacco Use  . Smoking status: Former Smoker    Types: Cigarettes  . Smokeless tobacco: Never Used  Substance Use Topics  . Alcohol use: Yes    Comment: Occasional.  . Drug use: Yes    Types: Marijuana    Review of Systems  Constitutional: No fever/chills Eyes: No visual changes. ENT: Positive sore throat. Cardiovascular: Denies chest pain.  Respiratory: Negative shortness of breath. Positive cough.  Gastrointestinal: No abdominal pain.  No nausea, no vomiting. Positive diarrhea.  No constipation. Genitourinary: Negative for dysuria. Musculoskeletal: Negative for back pain. Skin: Negative for rash. Neurological: Negative for headaches, focal weakness or numbness.  10-point ROS otherwise negative.  ____________________________________________   PHYSICAL EXAM:  VITAL SIGNS: ED Triage Vitals  Enc Vitals Group     BP 03/10/19 2024 119/67     Pulse Rate 03/10/19 2024 74     Resp 03/10/19 2024 18     Temp 03/10/19 2024 98.2 F (36.8 C)     Temp Source 03/10/19 2024 Oral     SpO2 03/10/19 2024 97 %     Weight 03/10/19 2029 192 lb (87.1 kg)     Height 03/10/19 2029 5\' 5"  (1.651 m)     Pain Score 03/10/19 2028 6   Constitutional: Alert and oriented. Well appearing and in no acute distress. Eyes: Conjunctivae are normal.  Head: Atraumatic. Nose: No congestion/rhinnorhea. Mouth/Throat: Mucous membranes are moist.  Oropharynx with erythema. No exudate. No PTA. Managing oral secretions and speaking in a clear voice.  Neck: No stridor.   Cardiovascular: Normal rate, regular rhythm. Good peripheral circulation. Grossly normal heart sounds.   Respiratory: Normal respiratory effort.  No retractions. Lungs CTAB. Gastrointestinal: No distention.  Musculoskeletal: No gross deformities of extremities. Neurologic:  Normal speech and language.  Skin:  Skin is warm, dry and intact. No rash noted.   ____________________________________________  LABS (all labs ordered are listed, but only abnormal results are displayed)  Labs Reviewed  GROUP A STREP BY PCR  PREGNANCY, URINE   ____________________________________________  RADIOLOGY  Dg Chest 2 View  Result Date: 03/10/2019 CLINICAL DATA:  Cough. Sore throat today. EXAM: CHEST - 2 VIEW COMPARISON:  Radiograph 09/18/2013 FINDINGS: The cardiomediastinal contours are normal.  The lungs are clear. Pulmonary vasculature is normal. No consolidation, pleural effusion, or pneumothorax. No acute osseous abnormalities are seen. IMPRESSION: Unremarkable radiographs of the chest. Electronically Signed   By: Narda Rutherford M.D.   On: 03/10/2019 21:17    ____________________________________________   PROCEDURES  Procedure(s) performed:   Procedures  None  ____________________________________________   INITIAL IMPRESSION / ASSESSMENT AND PLAN / ED COURSE  Pertinent labs & imaging results that were available during my care of the patient were reviewed by me and considered in my medical decision making (see chart for details).  Patient presents to the emergency department with sore throat which is improving but has developed cough, congestion, diarrhea.  Symptoms seem consistent with viral type infection.  No fevers, travel history, known contacts to increase suspicion for COVID. Plan for CXR, strep PCR, and if negative have advised 2 weeks of quarantine.   CXR and strep negative. Pregnancy negative. Plan for supportive care, home isolation, and work note. Doubt COVID but discussed ED return precautions in detail.  ____________________________________________  FINAL CLINICAL IMPRESSION(S) / ED DIAGNOSES  Final diagnoses:  Viral upper respiratory tract infection  Viral pharyngitis     NEW OUTPATIENT MEDICATIONS STARTED DURING THIS VISIT:  Discharge Medication List as of 03/10/2019  9:28 PM    START taking these medications   Details  albuterol (PROVENTIL HFA;VENTOLIN HFA) 108 (90 Base) MCG/ACT inhaler Inhale 1-2 puffs into the lungs every 6 (six) hours as needed for wheezing or shortness of breath., Starting Sun 03/10/2019, Print    benzonatate (TESSALON) 100 MG capsule Take 1 capsule (100 mg total) by mouth 3 (three) times daily as needed for cough., Starting Sun 03/10/2019, Print        Note:  This document was prepared using Dragon voice recognition  software and may include unintentional dictation errors.  Alona Bene, MD Emergency Medicine    Long, Arlyss Repress, MD 03/10/19 2152

## 2019-03-10 NOTE — ED Notes (Signed)
Patient transported to X-ray 

## 2019-03-10 NOTE — ED Notes (Signed)
ED Provider at bedside. 

## 2019-03-12 ENCOUNTER — Telehealth: Payer: Self-pay

## 2019-03-12 NOTE — Telephone Encounter (Signed)
Called Pt to offer her an e-visit in place of 03/18/19 OV. 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 She will call back at another time to r/s

## 2019-03-18 ENCOUNTER — Ambulatory Visit: Payer: BLUE CROSS/BLUE SHIELD | Admitting: Neurology

## 2019-03-26 ENCOUNTER — Encounter: Payer: Self-pay | Admitting: Neurology

## 2019-07-12 ENCOUNTER — Emergency Department (HOSPITAL_BASED_OUTPATIENT_CLINIC_OR_DEPARTMENT_OTHER)
Admission: EM | Admit: 2019-07-12 | Discharge: 2019-07-12 | Disposition: A | Discharge status: Home / Self Care | Payer: Managed Care, Other (non HMO) | Attending: Emergency Medicine | Admitting: Emergency Medicine

## 2019-07-12 ENCOUNTER — Other Ambulatory Visit: Payer: Self-pay

## 2019-07-12 ENCOUNTER — Encounter (HOSPITAL_BASED_OUTPATIENT_CLINIC_OR_DEPARTMENT_OTHER): Payer: Self-pay | Admitting: *Deleted

## 2019-07-12 DIAGNOSIS — Z91040 Latex allergy status: Secondary | ICD-10-CM | POA: Insufficient documentation

## 2019-07-12 DIAGNOSIS — R197 Diarrhea, unspecified: Secondary | ICD-10-CM | POA: Insufficient documentation

## 2019-07-12 DIAGNOSIS — R112 Nausea with vomiting, unspecified: Secondary | ICD-10-CM | POA: Insufficient documentation

## 2019-07-12 DIAGNOSIS — F1210 Cannabis abuse, uncomplicated: Secondary | ICD-10-CM | POA: Insufficient documentation

## 2019-07-12 DIAGNOSIS — F17200 Nicotine dependence, unspecified, uncomplicated: Secondary | ICD-10-CM | POA: Insufficient documentation

## 2019-07-12 DIAGNOSIS — R61 Generalized hyperhidrosis: Secondary | ICD-10-CM | POA: Insufficient documentation

## 2019-07-12 LAB — CBC
HCT: 46.2 % — ABNORMAL HIGH (ref 36.0–46.0)
Hemoglobin: 14.9 g/dL (ref 12.0–15.0)
MCH: 29.5 pg (ref 26.0–34.0)
MCHC: 32.3 g/dL (ref 30.0–36.0)
MCV: 91.5 fL (ref 80.0–100.0)
Platelets: 249 10*3/uL (ref 150–400)
RBC: 5.05 MIL/uL (ref 3.87–5.11)
RDW: 12.8 % (ref 11.5–15.5)
WBC: 9.3 10*3/uL (ref 4.0–10.5)
nRBC: 0 % (ref 0.0–0.2)

## 2019-07-12 LAB — COMPREHENSIVE METABOLIC PANEL
ALT: 19 U/L (ref 0–44)
AST: 21 U/L (ref 15–41)
Albumin: 4.4 g/dL (ref 3.5–5.0)
Alkaline Phosphatase: 81 U/L (ref 38–126)
Anion gap: 10 (ref 5–15)
BUN: 8 mg/dL (ref 6–20)
CO2: 22 mmol/L (ref 22–32)
Calcium: 9.6 mg/dL (ref 8.9–10.3)
Chloride: 106 mmol/L (ref 98–111)
Creatinine, Ser: 0.84 mg/dL (ref 0.44–1.00)
GFR calc Af Amer: >60 mL/min (ref >60)
GFR calc non Af Amer: >60 mL/min (ref >60)
Glucose, Bld: 93 mg/dL (ref 70–99)
Potassium: 4.4 mmol/L (ref 3.5–5.1)
Sodium: 138 mmol/L (ref 135–145)
Total Bilirubin: 0.7 mg/dL (ref 0.3–1.2)
Total Protein: 7.7 g/dL (ref 6.5–8.1)

## 2019-07-12 LAB — PREGNANCY, URINE: Preg Test, Ur: NEGATIVE

## 2019-07-12 LAB — URINALYSIS, MICROSCOPIC (REFLEX): RBC / HPF: NONE SEEN RBC/hpf (ref 0–5)

## 2019-07-12 LAB — URINALYSIS, ROUTINE W REFLEX MICROSCOPIC
Bilirubin Urine: NEGATIVE
Glucose, UA: NEGATIVE mg/dL
Hgb urine dipstick: NEGATIVE
Ketones, ur: NEGATIVE mg/dL
Nitrite: NEGATIVE
Protein, ur: NEGATIVE mg/dL
Specific Gravity, Urine: 1.025 (ref 1.005–1.030)
pH: 6 (ref 5.0–8.0)

## 2019-07-12 LAB — LIPASE, BLOOD: Lipase: 32 U/L (ref 11–51)

## 2019-07-12 MED ORDER — ONDANSETRON 4 MG PO TBDP: 4.0000 mg | ORAL_TABLET | ORAL | 0 refills | Status: AC | PRN

## 2019-07-12 MED ORDER — ONDANSETRON 4 MG PO TBDP
4.0000 mg | ORAL_TABLET | Freq: Once | ORAL | Status: AC | PRN
  Administered 2019-07-12: 4 mg via ORAL
  Filled 2019-07-12: qty 1

## 2019-07-12 MED ORDER — LOPERAMIDE HCL 2 MG PO CAPS: 2.0000 mg | ORAL_CAPSULE | Freq: Four times a day (QID) | ORAL | 0 refills | Status: AC | PRN

## 2019-07-12 MED ORDER — SODIUM CHLORIDE 0.9% FLUSH
3.0000 mL | Freq: Once | INTRAVENOUS | Status: DC
  Filled 2019-07-12: qty 3

## 2019-07-12 NOTE — ED Triage Notes (Signed)
Vomiting and diarrhea x 3 days. Pain in her mid abdomen.

## 2019-07-12 NOTE — ED Provider Notes (Signed)
[facility] EMERGENCY DEPARTMENT Provider Note   CSN: [id number] Arrival date & time: 07/12/19  1928    History   Chief Complaint Chief Complaint  Patient presents with  . Emesis    HPI Jeanette Nicholson is a 28 y.o. female.     HPI Patient started 3 days ago with vomiting and diarrhea.  He reports he is having multiple episodes of each per day.  He denies he has associated abdominal pain or fever.  He reports that this started to get kind of sweaty feeling and nauseated and then go on to have either vomiting or diarrhea.  She reports that she tries to eat or drink anything she is usually throwing up.  No blood.  She thought it might of started with something she ate 3 days ago but it just has not seemed to go away.  She reports today however she is actually had less vomiting. Past Medical History:  Diagnosis Date  . Anxiety   . Bulimia   . Depression     Patient Active Problem List   Diagnosis Date Noted  . Abdominal pain 01/20/2014  . ADD (attention deficit disorder) 08/06/2013  . Nephrolithiasis 11/01/2012  . Contraception 10/21/2012  . Lumbar back pain 10/10/2012  . Multiple personality disorder (HCC) 08/13/2012  . Overactive bladder 05/01/2011  . Bipolar disorder (HCC) 05/01/2011    Past Surgical History:  Procedure Laterality Date  . CHOLECYSTECTOMY N/A 05/16/2013   Procedure: LAPAROSCOPIC CHOLECYSTECTOMY ;  Surgeon: Velora Hecklerodd M Gerkin, MD;  Location: [facility];  Service: General;  Laterality: N/A;     OB History   No obstetric history on file.      Home Medications    Prior to Admission medications   Medication Sig Start Date End Date Taking? Authorizing Provider  albuterol (PROVENTIL HFA;VENTOLIN HFA) 108 (90 Base) MCG/ACT inhaler Inhale 1-2 puffs into the lungs every 6 (six) hours as needed for wheezing or shortness of breath. 03/10/19  Yes Long, Arlyss RepressJoshua G, MD  aspirin-acetaminophen-caffeine (EXCEDRIN MIGRAINE) [phone] MG tablet Take 2 tablets by  mouth every 6 (six) hours as needed for headache.    [provider]  benzonatate (TESSALON) 100 MG capsule Take 1 capsule (100 mg total) by mouth 3 (three) times daily as needed for cough. 03/10/19   Long, Arlyss RepressJoshua G, MD  calcium carbonate (TUMS - DOSED IN MG ELEMENTAL CALCIUM) 500 MG chewable tablet Chew 2 tablets by mouth every 4 (four) hours as needed for indigestion or heartburn.    [provider]  loperamide (IMODIUM) 2 MG capsule Take 1 capsule (2 mg total) by mouth 4 (four) times daily as needed for diarrhea or loose stools. 07/12/19   Arby BarrettePfeiffer, Tyriq Moragne, MD  naproxen (NAPROSYN) 500 MG tablet Take 1 tablet (500 mg total) by mouth every 12 (twelve) hours as needed. 11/06/18   Drema DallasJaffe, Adam R, DO  omeprazole (PRILOSEC) 20 MG capsule Take 1 capsule (20 mg total) by mouth daily. 03/29/15   Street, Mercedes, PA-C  ondansetron (ZOFRAN ODT) 4 MG disintegrating tablet Take 1 tablet (4 mg total) by mouth every 4 (four) hours as needed for nausea or vomiting. 07/12/19   Arby BarrettePfeiffer, Mackenzy Grumbine, MD  ondansetron (ZOFRAN ODT) 8 MG disintegrating tablet Take 1 tablet (8 mg total) by mouth every 8 (eight) hours as needed for nausea or vomiting. 10/16/18   Linwood DibblesKnapp, Jon, MD  pseudoephedrine (SUDAFED) 30 MG tablet Take 30 mg by mouth every 6 (six) hours as needed for congestion.  [provider]  SUMAtriptan (IMITREX) 100 MG tablet TK 1/2-1 T PO PRN UP TO BID .AT LEAST 2 HOURS BETWEEN DOSES 11/01/18   [provider]  topiramate (TOPAMAX) 50 MG tablet Take 1 tablet (50 mg total) by mouth at bedtime. 11/06/18   Drema DallasJaffe, Adam R, DO  TRI-SPRINTEC 0.18/0.215/0.25 MG-35 MCG tablet TAKE ONE TABLET BY MOUTH EVERY DAY 08/18/13   Shelva MajesticHunter, Stephen O, MD    Family History Family History  Problem Relation Age of Onset  . Depression Mother   . Mental illness Mother     Social History Social History   Tobacco Use  . Smoking status: Current Every Day Smoker  . Smokeless tobacco: Never Used  Substance  Use Topics  . Alcohol use: Yes    Comment: Occasional.  . Drug use: Yes    Types: Marijuana     Allergies   Abilify [aripiprazole], Morphine and related, and Latex   Review of Systems Review of Systems 10 Systems reviewed and are negative for acute change except as noted in the HPI.   Physical Exam Updated Vital Signs BP 121/64   Pulse 64   Temp 98.5 F (36.9 C) (Oral)   Resp 14   Ht 5\' 5"  (1.651 m)   Wt 98.9 kg   LMP 06/12/2019   SpO2 98%   BMI 36.28 kg/m   Physical Exam Constitutional:      Appearance: She is well-developed.  HENT:     Head: Normocephalic and atraumatic.  Eyes:     Extraocular Movements: Extraocular movements intact.     Conjunctiva/sclera: Conjunctivae normal.  Neck:     Musculoskeletal: Neck supple.  Cardiovascular:     Rate and Rhythm: Normal rate and regular rhythm.     Heart sounds: Normal heart sounds.  Pulmonary:     Effort: Pulmonary effort is normal.     Breath sounds: Normal breath sounds.  Abdominal:     General: Bowel sounds are normal. There is no distension.     Palpations: Abdomen is soft.     Tenderness: There is no abdominal tenderness.  Musculoskeletal: Normal range of motion.  Skin:    General: Skin is warm and dry.  Neurological:     Mental Status: She is alert and oriented to person, place, and time.     GCS: GCS eye subscore is 4. GCS verbal subscore is 5. GCS motor subscore is 6.     Coordination: Coordination normal.      ED Treatments / Results  Labs (all labs ordered are listed, but only abnormal results are displayed) Labs Reviewed  CBC - Abnormal; Notable for the following components:      Result Value   HCT 46.2 (*)    All other components within normal limits  URINALYSIS, ROUTINE W REFLEX MICROSCOPIC - Abnormal; Notable for the following components:   APPearance HAZY (*)    Leukocytes,Ua SMALL (*)    All other components within normal limits  URINALYSIS, MICROSCOPIC (REFLEX) - Abnormal; Notable  for the following components:   Bacteria, UA RARE (*)    All other components within normal limits  LIPASE, BLOOD  COMPREHENSIVE METABOLIC PANEL  PREGNANCY, URINE    EKG None  Radiology No results found.  Procedures Procedures (including critical care time)  Medications Ordered in ED Medications  sodium chloride flush (NS) 0.9 % injection 3 mL (has no administration in time range)  ondansetron (ZOFRAN-ODT) disintegrating tablet 4 mg (4 mg Oral Given 07/12/19 1944)  Initial Impression / Assessment and Plan / ED Course  I have reviewed the triage vital signs and the nursing notes.  Pertinent labs & imaging results that were available during my care of the patient were reviewed by me and considered in my medical decision making (see chart for details).        Patient with vomiting and diarrhea without fever abdominal pain.  Clinically well in appearance.  Abdomen soft and nontender.  Labs within normal limits.  No signs of dehydration by exam or vital signs.  Symptoms actually seem to be improving today.  Will provide Zofran and Imodium for symptomatic treatment.  At this time patient is stable for discharge.  Return precautions have been reviewed.  Final Clinical Impressions(s) / ED Diagnoses   Final diagnoses:  Nausea vomiting and diarrhea    ED Discharge Orders         Ordered    ondansetron (ZOFRAN ODT) 4 MG disintegrating tablet  Every 4 hours PRN     07/12/19 2128    loperamide (IMODIUM) 2 MG capsule  4 times daily PRN     07/12/19 2128           Charlesetta Shanks, MD 07/12/19 2131

## 2019-07-14 ENCOUNTER — Other Ambulatory Visit: Payer: Self-pay

## 2019-07-14 ENCOUNTER — Emergency Department (HOSPITAL_BASED_OUTPATIENT_CLINIC_OR_DEPARTMENT_OTHER)
Admission: EM | Admit: 2019-07-14 | Discharge: 2019-07-14 | Disposition: A | Discharge status: Home / Self Care | Payer: Medicaid Other | Attending: Emergency Medicine | Admitting: Emergency Medicine

## 2019-07-14 DIAGNOSIS — R1084 Generalized abdominal pain: Secondary | ICD-10-CM | POA: Insufficient documentation

## 2019-07-14 DIAGNOSIS — F17200 Nicotine dependence, unspecified, uncomplicated: Secondary | ICD-10-CM | POA: Insufficient documentation

## 2019-07-14 DIAGNOSIS — R109 Unspecified abdominal pain: Secondary | ICD-10-CM

## 2019-07-14 DIAGNOSIS — Z91040 Latex allergy status: Secondary | ICD-10-CM | POA: Insufficient documentation

## 2019-07-14 DIAGNOSIS — Z79899 Other long term (current) drug therapy: Secondary | ICD-10-CM | POA: Insufficient documentation

## 2019-07-14 DIAGNOSIS — R197 Diarrhea, unspecified: Secondary | ICD-10-CM | POA: Insufficient documentation

## 2019-07-14 MED ORDER — DICYCLOMINE HCL 20 MG PO TABS: 20.0000 mg | ORAL_TABLET | Freq: Two times a day (BID) | ORAL | 0 refills | Status: AC | PRN

## 2019-07-14 NOTE — ED Provider Notes (Signed)
[facility] EMERGENCY DEPARTMENT Provider Note   CSN: [id number] Arrival date & time: 07/14/19  1524     History   Chief Complaint Chief Complaint  Patient presents with  . Diarrhea    HPI Jeanette Nicholson is a 28 y.o. female.     HPI Patient was seen 2 days ago for symptoms related to gastroenteritis.  Had laboratory work-up performed at that time.  Given prescriptions for Zofran and loperamide.  States that her nausea and vomiting have improved.  She continues to have loose stools and occasional abdominal cramps.  She denies having any fever or chills.  There is no blood or mucus in the stool.  States she is concerned about going back to work and having a "accident". Past Medical History:  Diagnosis Date  . Anxiety   . Bulimia   . Depression     Patient Active Problem List   Diagnosis Date Noted  . Abdominal pain 01/20/2014  . ADD (attention deficit disorder) 08/06/2013  . Nephrolithiasis 11/01/2012  . Contraception 10/21/2012  . Lumbar back pain 10/10/2012  . Multiple personality disorder (HCC) 08/13/2012  . Overactive bladder 05/01/2011  . Bipolar disorder (HCC) 05/01/2011    Past Surgical History:  Procedure Laterality Date  . CHOLECYSTECTOMY N/A 05/16/2013   Procedure: LAPAROSCOPIC CHOLECYSTECTOMY ;  Surgeon: Velora Hecklerodd M Gerkin, MD;  Location: [facility];  Service: General;  Laterality: N/A;     OB History   No obstetric history on file.      Home Medications    Prior to Admission medications   Medication Sig Start Date End Date Taking? Authorizing Provider  albuterol (PROVENTIL HFA;VENTOLIN HFA) 108 (90 Base) MCG/ACT inhaler Inhale 1-2 puffs into the lungs every 6 (six) hours as needed for wheezing or shortness of breath. 03/10/19   Long, Arlyss RepressJoshua G, MD  aspirin-acetaminophen-caffeine (EXCEDRIN MIGRAINE) [phone] MG tablet Take 2 tablets by mouth every 6 (six) hours as needed for headache.    [provider]  benzonatate (TESSALON) 100 MG  capsule Take 1 capsule (100 mg total) by mouth 3 (three) times daily as needed for cough. 03/10/19   Long, Arlyss RepressJoshua G, MD  calcium carbonate (TUMS - DOSED IN MG ELEMENTAL CALCIUM) 500 MG chewable tablet Chew 2 tablets by mouth every 4 (four) hours as needed for indigestion or heartburn.    [provider]  dicyclomine (BENTYL) 20 MG tablet Take 1 tablet (20 mg total) by mouth 2 (two) times daily as needed for spasms. 07/14/19   Loren RacerYelverton, Madailein Londo, MD  loperamide (IMODIUM) 2 MG capsule Take 1 capsule (2 mg total) by mouth 4 (four) times daily as needed for diarrhea or loose stools. 07/12/19   Arby BarrettePfeiffer, Marcy, MD  naproxen (NAPROSYN) 500 MG tablet Take 1 tablet (500 mg total) by mouth every 12 (twelve) hours as needed. 11/06/18   Drema DallasJaffe, Adam R, DO  omeprazole (PRILOSEC) 20 MG capsule Take 1 capsule (20 mg total) by mouth daily. 03/29/15   Street, Mercedes, PA-C  ondansetron (ZOFRAN ODT) 4 MG disintegrating tablet Take 1 tablet (4 mg total) by mouth every 4 (four) hours as needed for nausea or vomiting. 07/12/19   Arby BarrettePfeiffer, Marcy, MD  ondansetron (ZOFRAN ODT) 8 MG disintegrating tablet Take 1 tablet (8 mg total) by mouth every 8 (eight) hours as needed for nausea or vomiting. 10/16/18   Linwood DibblesKnapp, Jon, MD  pseudoephedrine (SUDAFED) 30 MG tablet Take 30 mg by mouth every 6 (six) hours as needed for congestion.    [provider]  SUMAtriptan (IMITREX) 100 MG tablet TK 1/2-1 T PO PRN UP TO BID .AT LEAST 2 HOURS BETWEEN DOSES 11/01/18   [provider]  topiramate (TOPAMAX) 50 MG tablet Take 1 tablet (50 mg total) by mouth at bedtime. 11/06/18   Drema DallasJaffe, Adam R, DO  TRI-SPRINTEC 0.18/0.215/0.25 MG-35 MCG tablet TAKE ONE TABLET BY MOUTH EVERY DAY 08/18/13   Shelva MajesticHunter, Stephen O, MD    Family History Family History  Problem Relation Age of Onset  . Depression Mother   . Mental illness Mother     Social History Social History   Tobacco Use  . Smoking status: Current Every Day Smoker  .  Smokeless tobacco: Never Used  Substance Use Topics  . Alcohol use: Yes    Comment: Occasional.  . Drug use: Yes    Types: Marijuana     Allergies   Abilify [aripiprazole], Morphine and related, and Latex   Review of Systems Review of Systems  Constitutional: Negative for chills and fever.  Respiratory: Negative for cough and shortness of breath.   Cardiovascular: Negative for chest pain.  Gastrointestinal: Positive for abdominal pain and diarrhea. Negative for constipation, nausea and vomiting.  Genitourinary: Negative for dysuria, flank pain and frequency.  Musculoskeletal: Negative for back pain, myalgias and neck pain.  Skin: Negative for rash and wound.  Neurological: Negative for dizziness, weakness, light-headedness, numbness and headaches.  All other systems reviewed and are negative.    Physical Exam Updated Vital Signs BP 117/70 (BP Location: Left Arm)   Temp 98.2 F (36.8 C) (Oral)   Resp 16   LMP 06/22/2019   SpO2 100%   Physical Exam Vitals signs and nursing note reviewed.  Constitutional:      Appearance: Normal appearance. She is well-developed.  HENT:     Head: Normocephalic and atraumatic.     Mouth/Throat:     Mouth: Mucous membranes are moist.  Eyes:     Extraocular Movements: Extraocular movements intact.     Pupils: Pupils are equal, round, and reactive to light.  Neck:     Musculoskeletal: Normal range of motion and neck supple.  Cardiovascular:     Rate and Rhythm: Normal rate and regular rhythm.  Pulmonary:     Effort: Pulmonary effort is normal.     Breath sounds: Normal breath sounds.  Abdominal:     General: Bowel sounds are normal. There is no distension.     Palpations: Abdomen is soft. There is no mass.     Tenderness: There is no abdominal tenderness. There is no right CVA tenderness, left CVA tenderness, guarding or rebound.     Hernia: No hernia is present.  Musculoskeletal: Normal range of motion.        General: No  tenderness.  Skin:    General: Skin is warm and dry.     Capillary Refill: Capillary refill takes less than 2 seconds.     Findings: No erythema or rash.  Neurological:     General: No focal deficit present.     Mental Status: She is alert and oriented to person, place, and time.  Psychiatric:        Behavior: Behavior normal.      ED Treatments / Results  Labs (all labs ordered are listed, but only abnormal results are displayed) Labs Reviewed - No data to display  EKG None  Radiology No results found.  Procedures Procedures (including critical care time)  Medications Ordered in ED Medications - No  data to display   Initial Impression / Assessment and Plan / ED Course  I have reviewed the triage vital signs and the nursing notes.  Pertinent labs & imaging results that were available during my care of the patient were reviewed by me and considered in my medical decision making (see chart for details).        Patient is very well-appearing.  Abdominal exam is benign.  No evidence of dehydration.  Reviewed patient's labs from 2 days ago.  Appears to have gastroenteritis versus foodborne illness.  Have instructed patient on best dietary options for relief of diarrhea.  Also give Bentyl as needed for abdominal cramping.  Had extended work excuse for a few days.  Return precautions given.  Final Clinical Impressions(s) / ED Diagnoses   Final diagnoses:  Diarrhea, unspecified type  Abdominal cramping    ED Discharge Orders         Ordered    dicyclomine (BENTYL) 20 MG tablet  2 times daily PRN     07/14/19 1641           Julianne Rice, MD 07/14/19 1647

## 2019-07-14 NOTE — ED Triage Notes (Addendum)
Eval in ED x 2 days ago for n/v/d,  meds prescribed are not helping and symptoms remain

## 2019-07-14 NOTE — ED Notes (Signed)
Pt verbalized understanding of dc instructions.

## 2019-09-18 ENCOUNTER — Encounter (HOSPITAL_BASED_OUTPATIENT_CLINIC_OR_DEPARTMENT_OTHER): Payer: Self-pay

## 2019-09-18 ENCOUNTER — Emergency Department (HOSPITAL_BASED_OUTPATIENT_CLINIC_OR_DEPARTMENT_OTHER): Payer: Self-pay

## 2019-09-18 ENCOUNTER — Emergency Department (HOSPITAL_BASED_OUTPATIENT_CLINIC_OR_DEPARTMENT_OTHER)
Admission: EM | Admit: 2019-09-18 | Discharge: 2019-09-19 | Disposition: A | Discharge status: Home / Self Care | Payer: Self-pay | Attending: Emergency Medicine | Admitting: Emergency Medicine

## 2019-09-18 ENCOUNTER — Other Ambulatory Visit: Payer: Self-pay

## 2019-09-18 DIAGNOSIS — Z87891 Personal history of nicotine dependence: Secondary | ICD-10-CM | POA: Diagnosis not present

## 2019-09-18 DIAGNOSIS — W010XXA Fall on same level from slipping, tripping and stumbling without subsequent striking against object, initial encounter: Secondary | ICD-10-CM | POA: Diagnosis not present

## 2019-09-18 DIAGNOSIS — Z79899 Other long term (current) drug therapy: Secondary | ICD-10-CM | POA: Insufficient documentation

## 2019-09-18 DIAGNOSIS — S60222A Contusion of left hand, initial encounter: Secondary | ICD-10-CM | POA: Diagnosis not present

## 2019-09-18 DIAGNOSIS — Y929 Unspecified place or not applicable: Secondary | ICD-10-CM | POA: Diagnosis not present

## 2019-09-18 DIAGNOSIS — Y990 Civilian activity done for income or pay: Secondary | ICD-10-CM | POA: Diagnosis not present

## 2019-09-18 DIAGNOSIS — Y939 Activity, unspecified: Secondary | ICD-10-CM | POA: Insufficient documentation

## 2019-09-18 DIAGNOSIS — S6992XA Unspecified injury of left wrist, hand and finger(s), initial encounter: Secondary | ICD-10-CM | POA: Diagnosis present

## 2019-09-18 DIAGNOSIS — T148XXA Other injury of unspecified body region, initial encounter: Secondary | ICD-10-CM | POA: Diagnosis not present

## 2019-09-18 DIAGNOSIS — Z91040 Latex allergy status: Secondary | ICD-10-CM | POA: Insufficient documentation

## 2019-09-18 DIAGNOSIS — Z939 Artificial opening status, unspecified: Secondary | ICD-10-CM | POA: Diagnosis not present

## 2019-09-18 MED ORDER — NAPROXEN 250 MG PO TABS
500.0000 mg | ORAL_TABLET | Freq: Once | ORAL | Status: AC
  Administered 2019-09-18: 500 mg via ORAL
  Filled 2019-09-18: qty 2

## 2019-09-18 MED ORDER — HYDROCODONE-ACETAMINOPHEN 5-325 MG PO TABS: 1.0000 | ORAL_TABLET | ORAL | 0 refills | Status: AC | PRN

## 2019-09-18 MED ORDER — HYDROCODONE-ACETAMINOPHEN 5-325 MG PO TABS
1.0000 | ORAL_TABLET | Freq: Once | ORAL | Status: AC
  Administered 2019-09-18: 1 via ORAL
  Filled 2019-09-18: qty 1

## 2019-09-18 NOTE — ED Triage Notes (Addendum)
Pt states she fell and caught self ~30 min PTA-pain left hand web and small knot like area at hand/wrist-NAD-steady gait

## 2019-09-18 NOTE — ED Provider Notes (Signed)
[place] [facility] Provider Note: Georgena Spurling, MD, FACEP  CSN: [id number] MRN: [id number] ARRIVAL: 09/18/19 at 2057 ROOM: [place] [facility]   HISTORY OF PRESENT ILLNESS  09/18/19 11:31 PM Jeanette Nicholson is a 28 y.o. female who slipped at work and fell fell about 30 minutes prior to arrival.  She caught herself with her left hand.  She is now having 8 out of 10 pain over her left thenar eminence with a small knot located medially.  Pain is worse with movement or palpation.  She states her fingers feel numb but she has not lost sensation entirely and is able to move all of her fingers.  She denies other injury.  She has used ice without adequate relief.    Past Medical History:  Diagnosis Date  . Anxiety   . Bulimia   . Depression     Past Surgical History:  Procedure Laterality Date  . CHOLECYSTECTOMY N/A 05/16/2013   Procedure: LAPAROSCOPIC CHOLECYSTECTOMY ;  Surgeon: Earnstine Regal, MD;  Location: [facility];  Service: General;  Laterality: N/A;    Family History  Problem Relation Age of Onset  . Depression Mother   . Mental illness Mother     Social History   Tobacco Use  . Smoking status: Former Research scientist (life sciences)  . Smokeless tobacco: Never Used  Substance Use Topics  . Alcohol use: Not Currently  . Drug use: Yes    Types: Marijuana    Prior to Admission medications   Medication Sig Start Date End Date Taking? Authorizing Provider  albuterol (PROVENTIL HFA;VENTOLIN HFA) 108 (90 Base) MCG/ACT inhaler Inhale 1-2 puffs into the lungs every 6 (six) hours as needed for wheezing or shortness of breath. 03/10/19   Long, Wonda Olds, MD  aspirin-acetaminophen-caffeine (EXCEDRIN MIGRAINE) [phone] MG tablet Take 2 tablets by mouth every 6 (six) hours as needed for headache.    [provider]  benzonatate (TESSALON) 100 MG capsule Take 1 capsule (100 mg total) by mouth 3 (three) times daily as needed for cough. 03/10/19   Long, Wonda Olds, MD   calcium carbonate (TUMS - DOSED IN MG ELEMENTAL CALCIUM) 500 MG chewable tablet Chew 2 tablets by mouth every 4 (four) hours as needed for indigestion or heartburn.    [provider]  dicyclomine (BENTYL) 20 MG tablet Take 1 tablet (20 mg total) by mouth 2 (two) times daily as needed for spasms. 07/14/19   Julianne Rice, MD  loperamide (IMODIUM) 2 MG capsule Take 1 capsule (2 mg total) by mouth 4 (four) times daily as needed for diarrhea or loose stools. 07/12/19   Charlesetta Shanks, MD  naproxen (NAPROSYN) 500 MG tablet Take 1 tablet (500 mg total) by mouth every 12 (twelve) hours as needed. 11/06/18   Pieter Partridge, DO  omeprazole (PRILOSEC) 20 MG capsule Take 1 capsule (20 mg total) by mouth daily. 03/29/15   Street, Mercedes, PA-C  ondansetron (ZOFRAN ODT) 4 MG disintegrating tablet Take 1 tablet (4 mg total) by mouth every 4 (four) hours as needed for nausea or vomiting. 07/12/19   Charlesetta Shanks, MD  ondansetron (ZOFRAN ODT) 8 MG disintegrating tablet Take 1 tablet (8 mg total) by mouth every 8 (eight) hours as needed for nausea or vomiting. 10/16/18   Dorie Rank, MD  pseudoephedrine (SUDAFED) 30 MG tablet Take 30 mg by mouth every 6 (six) hours as needed for congestion.    [provider]  SUMAtriptan (IMITREX) 100 MG tablet  TK 1/2-1 T PO PRN UP TO BID .AT LEAST 2 HOURS BETWEEN DOSES 11/01/18   [provider]  topiramate (TOPAMAX) 50 MG tablet Take 1 tablet (50 mg total) by mouth at bedtime. 11/06/18   Drema Dallas, DO  TRI-SPRINTEC 0.18/0.215/0.25 MG-35 MCG tablet TAKE ONE TABLET BY MOUTH EVERY DAY 08/18/13   Shelva Majestic, MD    Allergies Abilify [aripiprazole], Morphine and related, and Latex   REVIEW OF SYSTEMS  Negative except as noted here or in the History of Present Illness.   PHYSICAL EXAMINATION  Initial Vital Signs Blood pressure 122/83, pulse 66, temperature 98.6 F (37 C), temperature source Oral, resp. rate 18, height 5\' 5"  (1.651 m),  weight 95.3 kg, last menstrual period 09/09/2019, SpO2 100 %.  Examination General: Well-developed, well-nourished female in no acute distress; appearance consistent with age of record HENT: normocephalic; atraumatic Eyes: Normal appearance Neck: supple Heart: regular rate and rhythm Lungs: clear to auscultation bilaterally Abdomen: soft; nondistended; nontender; bowel sounds present Extremities: No deformity; tenderness of left thenar eminence with a small hematoma medially, subjectively altered sensation of fingers without complete absence of sensation, hand and fingers distally neurovascularly intact with intact tendon function:    Neurologic: Awake, alert and oriented; motor function intact in all extremities and symmetric; no facial droop Skin: Warm and dry Psychiatric: Normal mood and affect   RESULTS  Summary of this visit's results, reviewed by myself:   EKG Interpretation  Date/Time:    Ventricular Rate:    PR Interval:    QRS Duration:   QT Interval:    QTC Calculation:   R Axis:     Text Interpretation:        Laboratory Studies: No results found for this or any previous visit (from the past 24 hour(s)). Imaging Studies: Dg Hand Complete Left  Result Date: 09/18/2019 CLINICAL DATA:  28 year old female with trauma to the left hand. EXAM: LEFT HAND - COMPLETE 3+ VIEW COMPARISON:  None. FINDINGS: There is no evidence of fracture or dislocation. There is no evidence of arthropathy or other focal bone abnormality. Soft tissues are unremarkable. IMPRESSION: Negative. Electronically Signed   By: 35 M.D.   On: 09/18/2019 21:36    ED COURSE and MDM  Nursing notes and initial vitals signs, including pulse oximetry, reviewed.  Vitals:   09/18/19 2108  BP: 122/83  Pulse: 66  Resp: 18  Temp: 98.6 F (37 C)  TempSrc: Oral  SpO2: 100%  Weight: 95.3 kg  Height: 5\' 5"  (1.651 m)   Examination is consistent with hematoma of the left thenar eminence.   Her altered sensation may be due to contusion of the median and/or radial nerve.  There is no evidence of fracture on radiograph.  PROCEDURES    ED DIAGNOSES     ICD-10-CM   1. Fall from slip, trip, or stumble, initial encounter  W01.0XXA   2. Traumatic hematoma of left hand, initial encounter  S60.222A   3. Peripheral nerve contusion  T14. , MD 09/18/19 [phone]
